# Patient Record
Sex: Female | Born: 1965 | Race: White | Hispanic: No | Marital: Married | Smoking: Never smoker
Health system: Southern US, Community
[De-identification: ages and names within clinical notes are randomized; demographics above are authoritative.]

## PROBLEM LIST (undated history)

## (undated) DIAGNOSIS — K8020 Calculus of gallbladder without cholecystitis without obstruction: Secondary | ICD-10-CM

## (undated) DIAGNOSIS — I10 Essential (primary) hypertension: Secondary | ICD-10-CM

---

## 1998-03-20 ENCOUNTER — Other Ambulatory Visit: Admission: RE | Admit: 1998-03-20 | Discharge: 1998-03-20 | Payer: Self-pay | Admitting: Obstetrics and Gynecology

## 1999-03-26 ENCOUNTER — Other Ambulatory Visit: Admission: RE | Admit: 1999-03-26 | Discharge: 1999-03-26 | Payer: Self-pay | Admitting: Obstetrics and Gynecology

## 2000-04-06 ENCOUNTER — Other Ambulatory Visit: Admission: RE | Admit: 2000-04-06 | Discharge: 2000-04-06 | Payer: Self-pay | Admitting: Obstetrics and Gynecology

## 2000-12-13 ENCOUNTER — Other Ambulatory Visit: Admission: RE | Admit: 2000-12-13 | Discharge: 2000-12-13 | Payer: Self-pay | Admitting: Obstetrics and Gynecology

## 2001-07-14 ENCOUNTER — Inpatient Hospital Stay (HOSPITAL_COMMUNITY): Admission: AD | Admit: 2001-07-14 | Discharge: 2001-07-16 | Payer: Self-pay | Admitting: Obstetrics and Gynecology

## 2001-08-21 ENCOUNTER — Other Ambulatory Visit: Admission: RE | Admit: 2001-08-21 | Discharge: 2001-08-21 | Payer: Self-pay | Admitting: Obstetrics and Gynecology

## 2002-03-16 ENCOUNTER — Encounter: Admission: RE | Admit: 2002-03-16 | Discharge: 2002-03-16 | Payer: Self-pay | Admitting: Obstetrics and Gynecology

## 2002-03-16 ENCOUNTER — Encounter: Payer: Self-pay | Admitting: Obstetrics and Gynecology

## 2002-08-30 ENCOUNTER — Other Ambulatory Visit: Admission: RE | Admit: 2002-08-30 | Discharge: 2002-08-30 | Payer: Self-pay | Admitting: Obstetrics and Gynecology

## 2003-06-27 ENCOUNTER — Encounter: Admission: RE | Admit: 2003-06-27 | Discharge: 2003-06-27 | Payer: Self-pay | Admitting: Obstetrics and Gynecology

## 2003-06-27 ENCOUNTER — Encounter: Payer: Self-pay | Admitting: Obstetrics and Gynecology

## 2003-09-03 ENCOUNTER — Other Ambulatory Visit: Admission: RE | Admit: 2003-09-03 | Discharge: 2003-09-03 | Payer: Self-pay | Admitting: Obstetrics and Gynecology

## 2004-07-08 ENCOUNTER — Ambulatory Visit (HOSPITAL_COMMUNITY): Admission: RE | Admit: 2004-07-08 | Discharge: 2004-07-08 | Payer: Self-pay | Admitting: Obstetrics and Gynecology

## 2005-08-25 ENCOUNTER — Ambulatory Visit (HOSPITAL_COMMUNITY): Admission: RE | Admit: 2005-08-25 | Discharge: 2005-08-25 | Payer: Self-pay | Admitting: Obstetrics and Gynecology

## 2006-08-29 ENCOUNTER — Ambulatory Visit (HOSPITAL_COMMUNITY): Admission: RE | Admit: 2006-08-29 | Discharge: 2006-08-29 | Payer: Self-pay | Admitting: Obstetrics and Gynecology

## 2007-09-08 ENCOUNTER — Ambulatory Visit (HOSPITAL_COMMUNITY): Admission: RE | Admit: 2007-09-08 | Discharge: 2007-09-08 | Payer: Self-pay | Admitting: Obstetrics and Gynecology

## 2008-10-01 ENCOUNTER — Ambulatory Visit (HOSPITAL_COMMUNITY): Admission: RE | Admit: 2008-10-01 | Discharge: 2008-10-01 | Payer: Self-pay | Admitting: Obstetrics and Gynecology

## 2009-10-06 ENCOUNTER — Ambulatory Visit (HOSPITAL_COMMUNITY): Admission: RE | Admit: 2009-10-06 | Discharge: 2009-10-06 | Payer: Self-pay | Admitting: Obstetrics and Gynecology

## 2010-10-09 ENCOUNTER — Ambulatory Visit (HOSPITAL_COMMUNITY)
Admission: RE | Admit: 2010-10-09 | Discharge: 2010-10-09 | Payer: Self-pay | Source: Home / Self Care | Attending: Obstetrics and Gynecology | Admitting: Obstetrics and Gynecology

## 2011-03-12 NOTE — H&P (Signed)
[facility]  Patient:    Breanna Harrison, Breanna Harrison Visit Number: [id number] MRN: [id number]          Service Type: OBS Location: 910B 9165 01 Attending Physician:  Ermalene Searing Dictated by:   Lenoard Aden, M.D. Admit Date:  07/14/2001   CC:         [facility]   History and Physical  CHIEF COMPLAINT:              Mild pregnancy-induced hypertension.  HISTORY OF PRESENT ILLNESS:   The patient is a 45 year old white female, G1, P0, EDD July 19, 2001, at 39+ weeks, with elevated blood pressure and mild epigastric discomfort today.  OBSTETRICAL HISTORY:          Noncontributory.  ALLERGIES:                    SULFA.  MEDICATIONS:                  Prenatal vitamin.  FAMILY HISTORY:               Hypertension, myocardial infarction, and hypoglycemia.  SOCIAL HISTORY:               Denies any domestic or physical violence. Social history otherwise noncontributory.  PRENATAL LABORATORY DATA:     Blood type O positive, Rh antibody negative. Toxoplasmosis negative.  VDRL nonreactive.  Rubella immune.  Hepatitis B surface antigen negative.  HIV nonreactive.  GC and chlamydia negative.  GBS negative.  Pregnancy complicated by new-onset pregnancy-induced hypertension at 37 weeks with increase in symptoms noted today.  Otherwise, uncomplicated obstetric care.  PHYSICAL EXAMINATION:  GENERAL:                      Well-developed, well-nourished white female in no apparent distress.  VITAL SIGNS:                  Blood pressure 130/90.  HEENT:                        Normal.  LUNGS:                        Clear.  HEART:                        Regular rhythm.  ABDOMEN:                      Soft, gravid, nontender.  Estimated fetal weight of 8 pounds.  PELVIC:                       Cervix is 2-3 cm, 50%, vertex, and -1.  EXTREMITIES:                  No cords.  NEUROLOGIC:                   Nonfocal.  DTRs 2+.  No evidence of  clonus.  IMPRESSION:                   Term IUP with mild pregnancy-induced hypertension.  PLAN:                         Proceed with induction.  Check CBC, PIH laboratories.  Anticipate attempts  at vaginal delivery. Dictated by:   Lenoard Aden, M.D. Attending Physician:  Marina Gravel B DD:  07/14/01 TD:  07/14/01 Job: [id number]

## 2011-03-12 NOTE — Op Note (Signed)
[facility]  Patient:    Breanna Harrison, Breanna Harrison Visit Number: [id number] MRN: [id number]          Service Type: OBS Location: 910A 9107 01 Attending Physician:  Ermalene Searing Dictated by:   Lenoard Aden, M.D. Proc. Date: 07/14/01 Admit Date:  07/14/2001                             Operative Report  INDICATION:                   Indication for operative vaginal delivery is repetitive deep and variable decelerations with fetal bradycardia.  OBSTETRICIAN:                 Lenoard Aden, M.D.  DELIVERY NOTE:                After observing aforementioned pattern with fetal heart tones ranging in the 90 to 100 beat-per-minute range and variable decelerations into the 60s, decision is made to proceed with operative vaginal delivery in the form of a Mityvac mushroom cup.  Fetal positioning is noted to be ROP, +3 station.  Mityvac mushroom cup is applied in the appropriate location for four gentle pulls for delivery of a full-term living female over a central median episiotomy without extension, shoulders delivered easily for a full-term living female, Apgars 8/9.  Placenta delivered spontaneously intact, three-vessel cord noted.  Cervix and vagina without lacerations.  Episiotomy repaired with a 2-0 Vicryl Rapide in the standard fashion without complication.  Estimated blood loss 500 cc.  Mother and baby recovering well. Rectum is intact. Dictated by:   Lenoard Aden, M.D. Attending Physician:  Marina Gravel B DD:  07/14/01 TD:  07/15/01 Job: [id number]

## 2011-09-14 ENCOUNTER — Other Ambulatory Visit (HOSPITAL_COMMUNITY): Payer: Self-pay | Admitting: Obstetrics and Gynecology

## 2011-09-14 DIAGNOSIS — Z1231 Encounter for screening mammogram for malignant neoplasm of breast: Secondary | ICD-10-CM

## 2011-10-14 ENCOUNTER — Ambulatory Visit (HOSPITAL_COMMUNITY)
Admission: RE | Admit: 2011-10-14 | Discharge: 2011-10-14 | Disposition: A | Discharge status: Home / Self Care | Payer: Managed Care, Other (non HMO) | Source: Ambulatory Visit | Attending: Obstetrics and Gynecology | Admitting: Obstetrics and Gynecology

## 2011-10-14 DIAGNOSIS — Z1231 Encounter for screening mammogram for malignant neoplasm of breast: Secondary | ICD-10-CM | POA: Insufficient documentation

## 2012-09-25 ENCOUNTER — Other Ambulatory Visit (HOSPITAL_COMMUNITY): Payer: Self-pay | Admitting: Obstetrics and Gynecology

## 2012-09-25 DIAGNOSIS — Z1231 Encounter for screening mammogram for malignant neoplasm of breast: Secondary | ICD-10-CM

## 2012-10-16 ENCOUNTER — Ambulatory Visit (HOSPITAL_COMMUNITY)
Admission: RE | Admit: 2012-10-16 | Discharge: 2012-10-16 | Disposition: A | Discharge status: Home / Self Care | Payer: Managed Care, Other (non HMO) | Source: Ambulatory Visit | Attending: Obstetrics and Gynecology | Admitting: Obstetrics and Gynecology

## 2012-10-16 DIAGNOSIS — Z1231 Encounter for screening mammogram for malignant neoplasm of breast: Secondary | ICD-10-CM

## 2013-09-27 ENCOUNTER — Other Ambulatory Visit (HOSPITAL_COMMUNITY): Payer: Self-pay | Admitting: Obstetrics and Gynecology

## 2013-09-27 DIAGNOSIS — Z1231 Encounter for screening mammogram for malignant neoplasm of breast: Secondary | ICD-10-CM

## 2013-10-22 ENCOUNTER — Ambulatory Visit (HOSPITAL_COMMUNITY)
Admission: RE | Admit: 2013-10-22 | Discharge: 2013-10-22 | Disposition: A | Discharge status: Home / Self Care | Payer: BC Managed Care – PPO | Source: Ambulatory Visit | Attending: Obstetrics and Gynecology | Admitting: Obstetrics and Gynecology

## 2013-10-22 DIAGNOSIS — Z1231 Encounter for screening mammogram for malignant neoplasm of breast: Secondary | ICD-10-CM | POA: Insufficient documentation

## 2014-10-04 ENCOUNTER — Other Ambulatory Visit (HOSPITAL_COMMUNITY): Payer: Self-pay | Admitting: Obstetrics and Gynecology

## 2014-10-04 DIAGNOSIS — Z1231 Encounter for screening mammogram for malignant neoplasm of breast: Secondary | ICD-10-CM

## 2014-10-24 ENCOUNTER — Ambulatory Visit (HOSPITAL_COMMUNITY)
Admission: RE | Admit: 2014-10-24 | Discharge: 2014-10-24 | Disposition: A | Discharge status: Home / Self Care | Payer: BC Managed Care – PPO | Source: Ambulatory Visit | Attending: Obstetrics and Gynecology | Admitting: Obstetrics and Gynecology

## 2014-10-24 DIAGNOSIS — Z1231 Encounter for screening mammogram for malignant neoplasm of breast: Secondary | ICD-10-CM | POA: Diagnosis present

## 2016-09-17 ENCOUNTER — Emergency Department (HOSPITAL_BASED_OUTPATIENT_CLINIC_OR_DEPARTMENT_OTHER): Payer: BLUE CROSS/BLUE SHIELD

## 2016-09-17 ENCOUNTER — Encounter (HOSPITAL_BASED_OUTPATIENT_CLINIC_OR_DEPARTMENT_OTHER): Payer: Self-pay

## 2016-09-17 ENCOUNTER — Inpatient Hospital Stay (HOSPITAL_BASED_OUTPATIENT_CLINIC_OR_DEPARTMENT_OTHER)
Admission: EM | Admit: 2016-09-17 | Discharge: 2016-09-21 | DRG: 419 | Disposition: A | Discharge status: Home / Self Care | Payer: BLUE CROSS/BLUE SHIELD | Attending: Internal Medicine | Admitting: Internal Medicine

## 2016-09-17 DIAGNOSIS — I10 Essential (primary) hypertension: Secondary | ICD-10-CM | POA: Diagnosis present

## 2016-09-17 DIAGNOSIS — R1011 Right upper quadrant pain: Secondary | ICD-10-CM

## 2016-09-17 DIAGNOSIS — Z8249 Family history of ischemic heart disease and other diseases of the circulatory system: Secondary | ICD-10-CM

## 2016-09-17 DIAGNOSIS — K8010 Calculus of gallbladder with chronic cholecystitis without obstruction: Secondary | ICD-10-CM

## 2016-09-17 DIAGNOSIS — K8050 Calculus of bile duct without cholangitis or cholecystitis without obstruction: Secondary | ICD-10-CM | POA: Diagnosis not present

## 2016-09-17 DIAGNOSIS — R740 Nonspecific elevation of levels of transaminase and lactic acid dehydrogenase [LDH]: Secondary | ICD-10-CM | POA: Diagnosis present

## 2016-09-17 DIAGNOSIS — K8064 Calculus of gallbladder and bile duct with chronic cholecystitis without obstruction: Secondary | ICD-10-CM | POA: Diagnosis present

## 2016-09-17 DIAGNOSIS — R7401 Elevation of levels of liver transaminase levels: Secondary | ICD-10-CM

## 2016-09-17 DIAGNOSIS — Z419 Encounter for procedure for purposes other than remedying health state, unspecified: Secondary | ICD-10-CM

## 2016-09-17 HISTORY — DX: Essential (primary) hypertension: I10

## 2016-09-17 HISTORY — DX: Calculus of gallbladder without cholecystitis without obstruction: K80.20

## 2016-09-17 LAB — URINALYSIS, ROUTINE W REFLEX MICROSCOPIC
Bilirubin Urine: NEGATIVE
Glucose, UA: NEGATIVE mg/dL
Hgb urine dipstick: NEGATIVE
Ketones, ur: NEGATIVE mg/dL
Nitrite: NEGATIVE
PROTEIN: NEGATIVE mg/dL
SPECIFIC GRAVITY, URINE: 1.007 (ref 1.005–1.030)
pH: 8 (ref 5.0–8.0)

## 2016-09-17 LAB — CBC WITH DIFFERENTIAL/PLATELET
BASOS ABS: 0 10*3/uL (ref 0.0–0.1)
BASOS PCT: 0 %
EOS ABS: 0.1 10*3/uL (ref 0.0–0.7)
EOS PCT: 1 %
HEMATOCRIT: 39.3 % (ref 36.0–46.0)
Hemoglobin: 13.2 g/dL (ref 12.0–15.0)
Lymphocytes Relative: 35 %
Lymphs Abs: 4.7 10*3/uL — ABNORMAL HIGH (ref 0.7–4.0)
MCH: 31.1 pg (ref 26.0–34.0)
MCHC: 33.6 g/dL (ref 30.0–36.0)
MCV: 92.5 fL (ref 78.0–100.0)
MONO ABS: 1.1 10*3/uL — AB (ref 0.1–1.0)
MONOS PCT: 8 %
Neutro Abs: 7.5 10*3/uL (ref 1.7–7.7)
Neutrophils Relative %: 56 %
PLATELETS: 265 10*3/uL (ref 150–400)
RBC: 4.25 MIL/uL (ref 3.87–5.11)
RDW: 11.7 % (ref 11.5–15.5)
WBC: 13.4 10*3/uL — ABNORMAL HIGH (ref 4.0–10.5)

## 2016-09-17 LAB — URINE MICROSCOPIC-ADD ON: RBC / HPF: NONE SEEN RBC/hpf (ref 0–5)

## 2016-09-17 LAB — COMPREHENSIVE METABOLIC PANEL
ALBUMIN: 4 g/dL (ref 3.5–5.0)
ALT: 119 U/L — ABNORMAL HIGH (ref 14–54)
ANION GAP: 8 (ref 5–15)
AST: 106 U/L — ABNORMAL HIGH (ref 15–41)
Alkaline Phosphatase: 129 U/L — ABNORMAL HIGH (ref 38–126)
BILIRUBIN TOTAL: 1.5 mg/dL — AB (ref 0.3–1.2)
BUN: 13 mg/dL (ref 6–20)
CHLORIDE: 106 mmol/L (ref 101–111)
CO2: 25 mmol/L (ref 22–32)
Calcium: 8.9 mg/dL (ref 8.9–10.3)
Creatinine, Ser: 0.91 mg/dL (ref 0.44–1.00)
GFR calc Af Amer: >60 mL/min (ref >60)
GFR calc non Af Amer: >60 mL/min (ref >60)
GLUCOSE: 102 mg/dL — AB (ref 65–99)
POTASSIUM: 3.6 mmol/L (ref 3.5–5.1)
Sodium: 139 mmol/L (ref 135–145)
TOTAL PROTEIN: 7.5 g/dL (ref 6.5–8.1)

## 2016-09-17 LAB — LIPASE, BLOOD: LIPASE: 23 U/L (ref 11–51)

## 2016-09-17 LAB — PREGNANCY, URINE: PREG TEST UR: NEGATIVE

## 2016-09-17 MED ORDER — MORPHINE SULFATE (PF) 4 MG/ML IV SOLN
4.0000 mg | Freq: Once | INTRAVENOUS | Status: AC
  Administered 2016-09-17: 4 mg via INTRAVENOUS
  Filled 2016-09-17: qty 1

## 2016-09-17 MED ORDER — OXYCODONE HCL 5 MG PO TABS
5.0000 mg | ORAL_TABLET | ORAL | Status: DC | PRN
  Administered 2016-09-18: 5 mg via ORAL
  Filled 2016-09-17: qty 1

## 2016-09-17 MED ORDER — SODIUM CHLORIDE 0.9 % IV SOLN
INTRAVENOUS | Status: DC
  Administered 2016-09-17 – 2016-09-19 (×3): via INTRAVENOUS

## 2016-09-17 MED ORDER — ACETAMINOPHEN 650 MG RE SUPP: 650.0000 mg | Freq: Four times a day (QID) | RECTAL | Status: DC | PRN

## 2016-09-17 MED ORDER — ONDANSETRON HCL 4 MG/2ML IJ SOLN: 4.0000 mg | Freq: Four times a day (QID) | INTRAMUSCULAR | Status: DC | PRN

## 2016-09-17 MED ORDER — ONDANSETRON HCL 4 MG PO TABS: 4.0000 mg | ORAL_TABLET | Freq: Four times a day (QID) | ORAL | Status: DC | PRN

## 2016-09-17 MED ORDER — HYDROMORPHONE HCL 1 MG/ML IJ SOLN: 0.5000 mg | INTRAMUSCULAR | Status: DC | PRN

## 2016-09-17 MED ORDER — SODIUM CHLORIDE 0.9 % IV SOLN
Freq: Once | INTRAVENOUS | Status: AC
  Administered 2016-09-17: 20:00:00 via INTRAVENOUS

## 2016-09-17 MED ORDER — ACETAMINOPHEN 325 MG PO TABS
650.0000 mg | ORAL_TABLET | Freq: Four times a day (QID) | ORAL | Status: DC | PRN
  Administered 2016-09-18 – 2016-09-21 (×2): 650 mg via ORAL
  Filled 2016-09-17 (×2): qty 2

## 2016-09-17 MED ORDER — ENOXAPARIN SODIUM 40 MG/0.4ML ~~LOC~~ SOLN
40.0000 mg | Freq: Every day | SUBCUTANEOUS | Status: DC
  Administered 2016-09-17 – 2016-09-20 (×4): 40 mg via SUBCUTANEOUS
  Filled 2016-09-17 (×4): qty 0.4

## 2016-09-17 NOTE — ED Notes (Signed)
Patient transported to Ultrasound 

## 2016-09-17 NOTE — H&P (Signed)
History and Physical  Patient Name: Breanna Harrison     UJW:[id number]:[id number]    DOB: 12/18/1965    DOA: 09/17/2016 PCP: Eather ColasHUNTER, [name], FNP   Patient coming from: Home --> [facility]  Chief Complaint: Epigastric pain  HPI: Breanna Harrison is a 50 y.o. female with a past medical history significant for biliary colic and HTN who presents with epigastric pain for one day.  The patient has had several episodes of epigastric pain associated with nausea, not clearly associated with food, lasting 4-5 hours at a time, over the last several months. She saw her PCP about this 3 weeks ago after a particularly bad episode related to a weekend of more than usual alcohol use, had a right upper quadrant ultrasound that showed gallstones, was referred to a general surgeon, who suspected mild pancreatitis rather than gallstones and recommended watchful waiting.  Now in the last 24 hours, she developed her epigastric pain again.  This is severe, "like a knife under my breastbone" and radiating to the back and associated with nausea.  ED course: -Afebrile, heart rate 60s, respirations and pulse ox symmetry normal, blood pressure 179/91 -Na 139, K 3.6, Cr 0.91, WBC 13.4K, Hgb 13.2, UA showed leukocytes, pregnancy test negative -AST and ALT 106 and 119, respectively. Total bilirubin 1.5 -Lipase normal -Right upper quadrant ultrasound showed gallstones as well as a 10 mm CBD -The case was discussed with [place] [facility] Dr. Adela LankArmbruster who recommended trending LFT and GI consult for likely MRCP/ERCP and TRH were asked to accept in transfer for choledocholithiasis         ROS: Review of Systems  Constitutional: Negative for chills and fever.  Gastrointestinal: Positive for abdominal pain and nausea. Negative for vomiting.  All other systems reviewed and are negative.         Past Medical History:  Diagnosis Date  . Gallstones   . Hypertension     History reviewed. No pertinent surgical history.  Social History:  Patient lives with her husband and 827 year old son.  The patient walks unasisted.  She works for [employer] in [place].  She drinks rarely, sometimes in binges.  She does not smoke.  She is from [place] originally.    Allergies  Allergen Reactions  . Sulfamethoxazole Itching and Rash    Family history: family history includes Breast cancer in her mother; Gallbladder disease in her mother; Heart attack (age of onset: 5759) in her father; Parkinson's disease in her father; Stroke (age of onset: 2740) in her brother.  Prior to Admission medications   Medication Sig Start Date End Date Taking? Authorizing Provider  alum & mag hydroxide-simeth (MAALOX/MYLANTA) 200-200-20 MG/5ML suspension Take 30 mLs by mouth every 6 (six) hours as needed for indigestion or heartburn.   Yes Historical Provider, MD  aspirin-acetaminophen-caffeine (EXCEDRIN MIGRAINE) [phone] MG tablet Take 1 tablet by mouth every 6 (six) hours as needed for headache.   Yes Historical Provider, MD  calcium carbonate (TUMS - DOSED IN MG ELEMENTAL CALCIUM) 500 MG chewable tablet Chew 3 tablets by mouth as needed for indigestion or heartburn.   Yes Historical Provider, MD  Cholecalciferol (VITAMIN D3) 3000 units TABS Take 2 tablets by mouth daily with breakfast.   Yes Historical Provider, MD  lisinopril (PRINIVIL,ZESTRIL) 5 MG tablet Take 5 mg by mouth daily with breakfast.    Yes Historical Provider, MD  Multiple Vitamin (MULTIVITAMIN WITH MINERALS) TABS tablet Take 1 tablet by mouth daily with breakfast.   Yes Historical Provider, MD  naproxen sodium (ANAPROX) 220 MG tablet Take 440 mg by mouth daily as needed (for pain).   Yes Historical Provider, MD  Norethin Ace-Eth Estrad-FE (MELODETTA 24 FE) 1-20 MG-MCG(24) CHEW Chew 1 tablet by mouth daily.   Yes Historical Provider, MD       Physical Exam: BP 132/73 (BP Location: Right Arm)   Pulse 61   Temp 98 F (36.7 C) (Oral)   Resp 16   Ht 5\' 3"  (1.6 m)   Wt 67.1 kg (148 lb)   LMP  08/25/2016   SpO2 97%   BMI 26.22 kg/m  General appearance: Well-developed, adult female, alert and in no acute distress.   Eyes: Anicteric, conjunctiva pink, lids and lashes normal. PERRL.    ENT: No nasal deformity, discharge, epistaxis.  Hearing normal. OP moist without lesions.   Neck: No neck masses.  Trachea midline.  No thyromegaly/tenderness. Lymph: No cervical or supraclavicular lymphadenopathy. Skin: Warm and dry.  No jaundice.  No suspicious rashes or lesions. Cardiac: RRR, nl S1-S2, no murmurs appreciated.  Capillary refill is brisk.  JVP normal.  No LE edema.  Radial and DP pulses 2+ and symmetric. Respiratory: Normal respiratory rate and rhythm.  CTAB without rales or wheezes. Abdomen: Abdomen soft.  No TTP or guarding. No ascites, distension, hepatosplenomegaly.   MSK: No deformities or effusions.  No cyanosis or clubbing. Neuro: Cranial nerves normal.  Sensation intact to light touch. Speech is fluent.  Muscle strength normal.    Psych: Sensorium intact and responding to questions, attention normal.  Behavior appropriate.  Affect normal.  Judgment and insight appear normal.     Labs on Admission:  I have personally reviewed following labs and imaging studies: CBC:  Recent Labs Lab 09/17/16 1729  WBC 13.4*  NEUTROABS 7.5  HGB 13.2  HCT 39.3  MCV 92.5  PLT 265   Basic Metabolic Panel:  Recent Labs Lab 09/17/16 1729  NA 139  K 3.6  CL 106  CO2 25  GLUCOSE 102*  BUN 13  CREATININE 0.91  CALCIUM 8.9   GFR: Estimated Creatinine Clearance: 67.3 mL/min (by C-G formula based on SCr of 0.91 mg/dL).  Liver Function Tests:  Recent Labs Lab 09/17/16 1729  AST 106*  ALT 119*  ALKPHOS 129*  BILITOT 1.5*  PROT 7.5  ALBUMIN 4.0    Recent Labs Lab 09/17/16 1729  LIPASE 23   No results for input(s): AMMONIA in the last 168 hours. Coagulation Profile: No results for input(s): INR, PROTIME in the last 168 hours. Cardiac Enzymes: No results for  input(s): CKTOTAL, CKMB, CKMBINDEX, TROPONINI in the last 168 hours. BNP (last 3 results) No results for input(s): PROBNP in the last 8760 hours. HbA1C: No results for input(s): HGBA1C in the last 72 hours. CBG: No results for input(s): GLUCAP in the last 168 hours. Lipid Profile: No results for input(s): CHOL, HDL, LDLCALC, TRIG, CHOLHDL, LDLDIRECT in the last 72 hours. Thyroid Function Tests: No results for input(s): TSH, T4TOTAL, FREET4, T3FREE, THYROIDAB in the last 72 hours. Anemia Panel: No results for input(s): VITAMINB12, FOLATE, FERRITIN, TIBC, IRON, RETICCTPCT in the last 72 hours. Sepsis Labs: Invalid input(s): PROCALCITONIN, LACTICIDVEN No results found for this or any previous visit (from the past 240 hour(s)).       Radiological Exams on Admission: Personally reviewed [place] report: US Abdomen Limited Ruq  Result Date: 09/17/2016 CLINICAL DATA:  Epigastric pain for 1 day EXAM: US ABDOMEN LIMITED - RIGHT UPPER QUADRANT COMPARISON:  09/03/2016 FINDINGS: Gallbladder:  Cholelithiasis is noted and well distended gallbladder. No significant gallbladder wall thickening is seen. No pericholecystic fluid is noted. Mild increased tenderness is noted over the gallbladder. Common bile duct: Diameter: 10 mm. There is increased echogenicity noted in the distal common bile duct with significant enlargement when compared with the prior exam consistent with distal common bile duct stones. Previous common bile duct measurement was 3 mm. Liver: No focal lesion identified. Mild intrahepatic ductal dilatation is seen. IMPRESSION: Cholelithiasis as well as evidence of distal common bile duct stones with significant biliary dilatation when compared with the prior exam. Electronically Signed   By: Alcide CleverMark  Lukens M.D.   On: 09/17/2016 18:32        Assessment/Plan Principal Problem:   Choledocholithiasis Active Problems:   Essential hypertension  1. Choledocholithiasis:  No imaging evidence of  cholecystitis, pain currently resolved.  Will defer antibiotics for now. -Trend CBC -NPO and MIVF -Ondansetron for nausea, oxyodone or hydromorphone IV for pain -Trend LFT -Consult to GI, appreciate cares -Consult to General Surgery for consideration of cholecystectomy post-ER/MRCP    2. HTN:  -Continue lisinopril  3. Other medications:  -Continue home OCPs          DVT prophylaxis: Lovenox  Code Status: Full  Family Communication: None present  Disposition Plan: Anticipate IV fluids, NPO and trend LFTs.  GI consult for recommendations re: MR vs ERCP.  Then General Surgery consultation. Consults called: Gen Surg, GI Admission status: INPATIENT, med surg         Medical decision making: Patient seen at 10:15 PM on 09/17/2016.  The patient was discussed with Dr. Andrey CampanileWilson and Mattie MarlinJessica Focht, PA-C.  What exists of the patient's chart and outside records in CareEverywhere was reviewed in depth and summarized above.  Clinical condition: stable.        Alberteen SamChristopher P Cheryl Stabenow [facility] Pager [phone]

## 2016-09-17 NOTE — Progress Notes (Signed)
50 yo F with history of biliary colic presents with choledocholithiasis.  BP 124/83 (BP Location: Right Arm)   Pulse 61   Temp 98 F (36.7 C) (Oral)   Resp 16   Ht 5\' 3"  (1.6 m)   Wt 67.1 kg (148 lb)   LMP 08/25/2016   SpO2 99%   BMI 26.22 kg/m    AST/ALT mildly elevated, TBili 1.5.  RUQ US shows dilated CBD.  Lipase normal.   GI Armbruster will see tomorrow.  Trend LFTs.  If trending down, CCS may take to surgery, if stable or trending up, likely will need MRCP/ERCP first, per GI.   To med surg, inpt status.  Please add [facility]

## 2016-09-17 NOTE — ED Notes (Signed)
ED Provider at bedside. 

## 2016-09-17 NOTE — Progress Notes (Signed)
Breanna Harrison is a 50 y.o. female patient admitted from on transportation awake, alert - oriented  X 4 - no acute distress noted.  VSS -     IV in place, occlusive dsg intact without redness.  Orientation to room, and floor completed with information packet given to patient/family.  Patient declined safety video at this time.  Admission INP armband ID verified with patient/family, and in place.   SR up x 2, fall assessment complete, with patient and family able to verbalize understanding of risk associated with falls, and verbalized understanding to call nsg before up out of bed.  Call light within reach, patient able to voice, and demonstrate understanding.  Skin, clean-dry- intact without evidence of bruising, or skin tears.   No evidence of skin break down noted on exam.    Will cont to eval and treat per MD orders.  Breanna HeftyPatricia D Ramirez, RN 09/17/2016 10:19 PM

## 2016-09-17 NOTE — ED Notes (Signed)
Report given to PAtty RN 5 west, nurse aware pt will be coming by POV

## 2016-09-17 NOTE — ED Triage Notes (Signed)
C/o abd pain that feels same as recent dx of gallstone-NAD-steady gait

## 2016-09-17 NOTE — Progress Notes (Signed)
Paged on call MD about patient's arrival to unit/floor.

## 2016-09-17 NOTE — ED Provider Notes (Signed)
[facility] Provider Note   CSN: [id number] Arrival date & time: 09/17/16  1549  By signing my name below, I, [name], attest that this documentation has been prepared under the direction and in the presence of Mattie Marlin, PA-C. Electronically Signed: [name], Scribe. 09/17/2016. 5:51 PM.  History   Chief Complaint Chief Complaint  Patient presents with  . Abdominal Pain    The history is provided by the patient. No language interpreter was used.     HPI Comments:  Breanna Harrison is a 50 y.o. female who presents to the Emergency Department complaining of constant abdominal pain since yesterday. Pt reports diffuse pain at this time but notes RUQ pain at time of onset. She describes her pain as sharp and notes the pain radiates into her mid back. Pt has been experiecing this same pain intermittently x a few months secondary to gallstones. She notes she had a "bad" episode on 08/24/16 after eating fried foods; notes she has not had an episode until yesterday. She had greasy foods last night. Pt has been taking Nexium for acid reflux but has been off of it for ~ 2 months. She reports associated nausea. She has taken aleve and tylenol without relief. No vomiting, melena, fever, chills, SOB, hematuria, dysuria, and frequent/urgent urination. No alleviating factors noted. Pt has also had a surgical consult about the possible removal of her gallbladder but nothing was scheduled as the surgeon stated pt's pancreatic enzymes were not elevated. No h/o abdominal surgeries. Pt also has BMs infrequently; states she has 1 ~once a week and this is a chronic occurrence for her.  ETOH use is sparring. Pt last ate ~1300 but she has been drinking water since.   Past Medical History:  Diagnosis Date  . Gallstones   . Hypertension     There are no active problems to display for this patient.   History reviewed. No pertinent surgical history.  OB History    No data available       Home Medications    Prior to Admission medications   Medication Sig Start Date End Date Taking? Authorizing Provider  LISINOPRIL PO Take by mouth.   Yes Historical Provider, MD    Family History No family history on file.  Social History Social History  Substance Use Topics  . Smoking status: Never Smoker  . Smokeless tobacco: Never Used  . Alcohol use Yes     Comment: occ     Allergies   Sulfa antibiotics   Review of Systems Review of Systems  Constitutional: Negative for chills and fever.  Respiratory: Negative for shortness of breath.   Gastrointestinal: Positive for abdominal pain and nausea. Negative for blood in stool and vomiting.  Genitourinary: Negative for dysuria, frequency, hematuria and urgency.  Musculoskeletal: Positive for back pain.  All other systems reviewed and are negative.    Physical Exam Updated Vital Signs BP 179/91 (BP Location: Left Arm)   Pulse 69   Temp 98 F (36.7 C) (Oral)   Resp 18   Ht 5\' 3"  (1.6 m)   Wt 148 lb (67.1 kg)   LMP 08/25/2016   SpO2 100%   BMI 26.22 kg/m   Physical Exam  Constitutional: She is oriented to person, place, and time. She appears well-developed and well-nourished. No distress.  HENT:  Head: Normocephalic and atraumatic.  Eyes: Conjunctivae are normal.  Neck: Normal range of motion.  Cardiovascular: Normal rate, regular rhythm and normal heart sounds.  Exam reveals  no gallop and no friction rub.   No murmur heard. Pulses:      Radial pulses are 2+ on the right side, and 2+ on the left side.  Pulmonary/Chest: Effort normal and breath sounds normal. No respiratory distress. She has no decreased breath sounds. She has no wheezes. She has no rhonchi. She has no rales.  Abdominal: Soft. Normal appearance and bowel sounds are normal. She exhibits no distension. There is tenderness in the right upper quadrant, epigastric area and suprapubic area. There is positive Murphy's sign. There is no rigidity,  no rebound, no guarding and no CVA tenderness.  Musculoskeletal: Normal range of motion. She exhibits no edema.  Neurological: She is alert and oriented to person, place, and time.  Skin: Skin is warm and dry.  Psychiatric: She has a normal mood and affect.  Nursing note and vitals reviewed.    ED Treatments / Results  DIAGNOSTIC STUDIES:  Oxygen Saturation is 100% on RA, normal by my interpretation.    COORDINATION OF CARE:  5:47 PM Discussed treatment plan with pt at bedside and pt agreed to plan.  Labs (all labs ordered are listed, but only abnormal results are displayed) Labs Reviewed  URINALYSIS, ROUTINE W REFLEX MICROSCOPIC (NOT AT [facility]) - Abnormal; Notable for the following:       Result Value   Leukocytes, UA SMALL (*)    All other components within normal limits  URINE MICROSCOPIC-ADD ON - Abnormal; Notable for the following:    Squamous Epithelial / LPF 0-5 (*)    Bacteria, UA MANY (*)    All other components within normal limits  PREGNANCY, URINE  CBC WITH DIFFERENTIAL/PLATELET  COMPREHENSIVE METABOLIC PANEL    EKG  EKG Interpretation None       Radiology No results found.  Procedures Procedures (including critical care time)  Medications Ordered in ED Medications - No data to display   Initial Impression / Assessment and Plan / ED Course  I have reviewed the triage vital signs and the nursing notes.  Pertinent labs & imaging results that were available during my care of the patient were reviewed by me and considered in my medical decision making (see chart for details).  Clinical Course    Pt with abdominal pain. Hx of Cholelithiasis. RUQ ultrasound revealed cholelithiasis with common bile duct dilatation concerning for common bile duct obstruction. Mild leukocytosis, afebrile. Elevated LFTs, total bilirubin, and alkaline phosphatase. Normal lipase. Patient was made nothing by mouth and started on fluids in the ED. Her pain was well-controlled. I  consulted GI and spoke with Dr. Adela LankArmbruster who will see the patient tomorrow in the a.m. and asked that we consult the hospitalist team for admission. I counseled the hospitalist and spoke with Dr. Sherrill Raringanford Makanda who admitted the patient.  [facility] by her husband. Dr. Clarene DukeLittle was agreeable to this. Patient was stable at time of discharge.  Thank you Dr. Adela LankArmbruster and Dr. Maryfrances Bunnellanford for your consult, timing care of this patient.  Patient case discussed and patient seen by Dr. Clarene DukeLittle who agrees with the above plan.  Final Clinical Impressions(s) / ED Diagnoses   Final diagnoses:  RUQ pain    New Prescriptions New Prescriptions   No medications on file   I personally performed the services described in this documentation, which was scribed in my presence. The recorded information has been reviewed and is accurate.       Jerre SimonJessica L Nikola Blackston,  PA 09/17/16 2122    Laurence Spatesachel Morgan Little, MD 09/21/16 [phone]

## 2016-09-18 ENCOUNTER — Encounter (HOSPITAL_COMMUNITY)
Admission: EM | Disposition: A | Discharge status: Home / Self Care | Payer: Self-pay | Source: Home / Self Care | Attending: Internal Medicine

## 2016-09-18 ENCOUNTER — Encounter (HOSPITAL_COMMUNITY): Payer: Self-pay | Admitting: *Deleted

## 2016-09-18 ENCOUNTER — Inpatient Hospital Stay (HOSPITAL_COMMUNITY): Payer: BLUE CROSS/BLUE SHIELD

## 2016-09-18 ENCOUNTER — Inpatient Hospital Stay (HOSPITAL_COMMUNITY): Payer: BLUE CROSS/BLUE SHIELD | Admitting: Anesthesiology

## 2016-09-18 DIAGNOSIS — K8010 Calculus of gallbladder with chronic cholecystitis without obstruction: Secondary | ICD-10-CM

## 2016-09-18 DIAGNOSIS — K8050 Calculus of bile duct without cholangitis or cholecystitis without obstruction: Secondary | ICD-10-CM

## 2016-09-18 DIAGNOSIS — R740 Nonspecific elevation of levels of transaminase and lactic acid dehydrogenase [LDH]: Secondary | ICD-10-CM

## 2016-09-18 HISTORY — PX: ERCP: SHX5425

## 2016-09-18 LAB — CBC
HEMATOCRIT: 36.9 % (ref 36.0–46.0)
HEMOGLOBIN: 12.4 g/dL (ref 12.0–15.0)
MCH: 31.2 pg (ref 26.0–34.0)
MCHC: 33.6 g/dL (ref 30.0–36.0)
MCV: 92.7 fL (ref 78.0–100.0)
Platelets: 249 10*3/uL (ref 150–400)
RBC: 3.98 MIL/uL (ref 3.87–5.11)
RDW: 12.4 % (ref 11.5–15.5)
WBC: 10.7 10*3/uL — ABNORMAL HIGH (ref 4.0–10.5)

## 2016-09-18 LAB — COMPREHENSIVE METABOLIC PANEL
ALBUMIN: 3.5 g/dL (ref 3.5–5.0)
ALK PHOS: 130 U/L — AB (ref 38–126)
ALT: 149 U/L — ABNORMAL HIGH (ref 14–54)
ANION GAP: 5 (ref 5–15)
AST: 117 U/L — ABNORMAL HIGH (ref 15–41)
BILIRUBIN TOTAL: 1.4 mg/dL — AB (ref 0.3–1.2)
BUN: 14 mg/dL (ref 6–20)
CALCIUM: 8.4 mg/dL — AB (ref 8.9–10.3)
CO2: 22 mmol/L (ref 22–32)
CREATININE: 0.92 mg/dL (ref 0.44–1.00)
Chloride: 112 mmol/L — ABNORMAL HIGH (ref 101–111)
GFR calc Af Amer: >60 mL/min (ref >60)
GFR calc non Af Amer: >60 mL/min (ref >60)
GLUCOSE: 90 mg/dL (ref 65–99)
Potassium: 3.9 mmol/L (ref 3.5–5.1)
Sodium: 139 mmol/L (ref 135–145)
TOTAL PROTEIN: 6.6 g/dL (ref 6.5–8.1)

## 2016-09-18 SURGERY — ERCP, WITH INTERVENTION IF INDICATED
Anesthesia: General

## 2016-09-18 MED ORDER — DEXAMETHASONE SODIUM PHOSPHATE 10 MG/ML IJ SOLN
INTRAMUSCULAR | Status: DC | PRN
  Administered 2016-09-18: 10 mg via INTRAVENOUS

## 2016-09-18 MED ORDER — LISINOPRIL 5 MG PO TABS
5.0000 mg | ORAL_TABLET | Freq: Every day | ORAL | Status: DC
  Administered 2016-09-18 – 2016-09-21 (×3): 5 mg via ORAL
  Filled 2016-09-18 (×3): qty 1

## 2016-09-18 MED ORDER — LIDOCAINE 2% (20 MG/ML) 5 ML SYRINGE
INTRAMUSCULAR | Status: DC | PRN
  Administered 2016-09-18: 80 mg via INTRAVENOUS

## 2016-09-18 MED ORDER — PROPOFOL 10 MG/ML IV BOLUS
INTRAVENOUS | Status: DC | PRN
  Administered 2016-09-18: 60 mg via INTRAVENOUS
  Administered 2016-09-18: 140 mg via INTRAVENOUS

## 2016-09-18 MED ORDER — SUGAMMADEX SODIUM 200 MG/2ML IV SOLN
INTRAVENOUS | Status: AC
  Filled 2016-09-18: qty 2

## 2016-09-18 MED ORDER — FENTANYL CITRATE (PF) 100 MCG/2ML IJ SOLN
INTRAMUSCULAR | Status: AC
  Filled 2016-09-18: qty 2

## 2016-09-18 MED ORDER — SUCCINYLCHOLINE CHLORIDE 200 MG/10ML IV SOSY
PREFILLED_SYRINGE | INTRAVENOUS | Status: DC | PRN
Start: 2016-09-18 — End: 2016-09-18
  Administered 2016-09-18: 100 mg via INTRAVENOUS

## 2016-09-18 MED ORDER — LIDOCAINE 2% (20 MG/ML) 5 ML SYRINGE
INTRAMUSCULAR | Status: AC
Start: 2016-09-18 — End: 2016-09-18
  Filled 2016-09-18: qty 5

## 2016-09-18 MED ORDER — GLUCAGON HCL RDNA (DIAGNOSTIC) 1 MG IJ SOLR
INTRAMUSCULAR | Status: AC
  Filled 2016-09-18: qty 1

## 2016-09-18 MED ORDER — NORETHIN ACE-ETH ESTRAD-FE 1-20 MG-MCG(24) PO CHEW
1.0000 | CHEWABLE_TABLET | Freq: Every day | ORAL | Status: DC
  Administered 2016-09-21: 1 via ORAL

## 2016-09-18 MED ORDER — ESMOLOL HCL 100 MG/10ML IV SOLN
INTRAVENOUS | Status: DC | PRN
  Administered 2016-09-18: 40 mg via INTRAVENOUS

## 2016-09-18 MED ORDER — IOPAMIDOL (ISOVUE-370) INJECTION 76%
INTRAVENOUS | Status: DC | PRN
  Administered 2016-09-18: 55 mg via INTRAVENOUS

## 2016-09-18 MED ORDER — CIPROFLOXACIN IN D5W 400 MG/200ML IV SOLN: INTRAVENOUS | Status: DC | PRN

## 2016-09-18 MED ORDER — MIDAZOLAM HCL 2 MG/2ML IJ SOLN
INTRAMUSCULAR | Status: AC
  Filled 2016-09-18: qty 2

## 2016-09-18 MED ORDER — PROPOFOL 10 MG/ML IV BOLUS
INTRAVENOUS | Status: AC
  Filled 2016-09-18: qty 20

## 2016-09-18 MED ORDER — CIPROFLOXACIN IN D5W 400 MG/200ML IV SOLN
400.0000 mg | Freq: Once | INTRAVENOUS | Status: AC
  Administered 2016-09-18: 400 mg via INTRAVENOUS

## 2016-09-18 MED ORDER — ONDANSETRON HCL 4 MG/2ML IJ SOLN
INTRAMUSCULAR | Status: AC
  Filled 2016-09-18: qty 2

## 2016-09-18 MED ORDER — LACTATED RINGERS IV SOLN
INTRAVENOUS | Status: DC
  Administered 2016-09-18: 1000 mL via INTRAVENOUS

## 2016-09-18 MED ORDER — FENTANYL CITRATE (PF) 100 MCG/2ML IJ SOLN
INTRAMUSCULAR | Status: DC | PRN
  Administered 2016-09-18 (×2): 50 ug via INTRAVENOUS

## 2016-09-18 MED ORDER — MIDAZOLAM HCL 5 MG/5ML IJ SOLN
INTRAMUSCULAR | Status: DC | PRN
  Administered 2016-09-18: 2 mg via INTRAVENOUS

## 2016-09-18 MED ORDER — INDOMETHACIN 50 MG RE SUPP
RECTAL | Status: AC
  Filled 2016-09-18: qty 2

## 2016-09-18 MED ORDER — CIPROFLOXACIN IN D5W 400 MG/200ML IV SOLN
INTRAVENOUS | Status: AC
  Filled 2016-09-18: qty 200

## 2016-09-18 MED ORDER — ONDANSETRON HCL 4 MG/2ML IJ SOLN
INTRAMUSCULAR | Status: DC | PRN
  Administered 2016-09-18: 4 mg via INTRAVENOUS

## 2016-09-18 NOTE — Progress Notes (Signed)
PROGRESS NOTE                                                                                                                                                                                                             Patient Demographics:    Breanna Harrison, is a 50 y.o. female, DOB - 08/28/1966, WUJ:[id number]  Admit date - 09/17/2016   Admitting Physician Alberteen Sam, MD  Outpatient Primary MD for the patient is HUNTER, [name], FNP  LOS - 1  Outpatient Specialists:None  Chief Complaint  Patient presents with  . Abdominal Pain       Brief Narrative   50 year old female with hypertension and recently diagnosed biliary colic presenting with epigastric and right upper quadrant abdominal pain. Patient found to have acute choledocholithiasis.    Subjective:   Abdominal pain much better. Denies nausea or vomiting.   Assessment  & Plan :    Principal Problem:   Acute Choledocholithiasis  Symptoms better with pain medication. Transaminitis persistent. Nothing by mouth. Supportive care with IV fluids and antiemetics. -Evaluated by GI and surgery.  recommend ERCP followed by cholecystectomy. - Active Problems:   Essential hypertension Stable. Continue lisinopril.     Code Status : Full code  Family Communication  : Husband and mother bedside  Disposition Plan  : Home after surgery  Barriers For Discharge : Active symptoms  Consults  :  GI CCS  Procedures  :  CT abdomen Pending ERCP and laparoscopic cholecystectomy  DVT Prophylaxis  :  Lovenox -   Lab Results  Component Value Date   PLT 249 09/18/2016    Antibiotics  :  None  Anti-infectives    None        Objective:   Vitals:   09/17/16 1913 09/17/16 2034 09/17/16 2145 09/18/16 0600  BP: 124/83 130/79 132/73 (!) 147/77  Pulse: 61 67 61 (!) 56  Resp: 16 16 16 17   Temp:    97.9 F (36.6 C)  TempSrc:      SpO2: 99% 100% 97%  98%  Weight:      Height:        Wt Readings from Last 3 Encounters:  09/17/16 67.1 kg (148 lb)     Intake/Output Summary (Last 24 hours) at 09/18/16 1216 Last data filed at 09/18/16 1000  Gross per  24 hour  Intake             1500 ml  Output                0 ml  Net             1500 ml     Physical Exam  Gen: not in distress HEENT: moist mucosa, supple neck Chest: clear b/l, no added sounds CVS: N S1&S2, no murmurs, GI: soft, Nondistended, bowel sounds present, mild right upper quadrant tenderness Musculoskeletal: warm, no edema     Data Review:    CBC  Recent Labs Lab 09/17/16 1729 09/18/16 0523  WBC 13.4* 10.7*  HGB 13.2 12.4  HCT 39.3 36.9  PLT 265 249  MCV 92.5 92.7  MCH 31.1 31.2  MCHC 33.6 33.6  RDW 11.7 12.4  LYMPHSABS 4.7*  --   MONOABS 1.1*  --   EOSABS 0.1  --   BASOSABS 0.0  --     Chemistries   Recent Labs Lab 09/17/16 1729 09/18/16 0523  NA 139 139  K 3.6 3.9  CL 106 112*  CO2 25 22  GLUCOSE 102* 90  BUN 13 14  CREATININE 0.91 0.92  CALCIUM 8.9 8.4*  AST 106* 117*  ALT 119* 149*  ALKPHOS 129* 130*  BILITOT 1.5* 1.4*   ------------------------------------------------------------------------------------------------------------------ No results for input(s): CHOL, HDL, LDLCALC, TRIG, CHOLHDL, LDLDIRECT in the last 72 hours.  No results found for: HGBA1C ------------------------------------------------------------------------------------------------------------------ No results for input(s): TSH, T4TOTAL, T3FREE, THYROIDAB in the last 72 hours.  Invalid input(s): FREET3 ------------------------------------------------------------------------------------------------------------------ No results for input(s): VITAMINB12, FOLATE, FERRITIN, TIBC, IRON, RETICCTPCT in the last 72 hours.  Coagulation profile No results for input(s): INR, PROTIME in the last 168 hours.  No results for input(s): DDIMER in the last 72  hours.  Cardiac Enzymes No results for input(s): CKMB, TROPONINI, MYOGLOBIN in the last 168 hours.  Invalid input(s): CK ------------------------------------------------------------------------------------------------------------------ No results found for: BNP  Inpatient Medications  Scheduled Meds: . enoxaparin (LOVENOX) injection  40 mg Subcutaneous QHS  . lisinopril  5 mg Oral Q breakfast  . Norethin Ace-Eth Estrad-FE  1 tablet Oral Daily   Continuous Infusions: . sodium chloride 125 mL/hr at 09/18/16 0715   PRN Meds:.acetaminophen **OR** acetaminophen, HYDROmorphone (DILAUDID) injection, ondansetron **OR** ondansetron (ZOFRAN) IV, oxyCODONE  Micro Results No results found for this or any previous visit (from the past 240 hour(s)).  Radiology Reports [place] Abdomen Limited Ruq  Result Date: 09/17/2016 CLINICAL DATA:  Epigastric pain for 1 day EXAM: US ABDOMEN LIMITED - RIGHT UPPER QUADRANT COMPARISON:  09/03/2016 FINDINGS: Gallbladder: Cholelithiasis is noted and well distended gallbladder. No significant gallbladder wall thickening is seen. No pericholecystic fluid is noted. Mild increased tenderness is noted over the gallbladder. Common bile duct: Diameter: 10 mm. There is increased echogenicity noted in the distal common bile duct with significant enlargement when compared with the prior exam consistent with distal common bile duct stones. Previous common bile duct measurement was 3 mm. Liver: No focal lesion identified. Mild intrahepatic ductal dilatation is seen. IMPRESSION: Cholelithiasis as well as evidence of distal common bile duct stones with significant biliary dilatation when compared with the prior exam. Electronically Signed   By: Alcide CleverMark  Lukens M.D.   On: 09/17/2016 18:32    Time Spent in minutes  25   Eddie NorthHUNGEL, Weldon Nouri M.D on 09/18/2016 at 12:16 PM  Between 7am to 7pm - Pager - [phone]  After 7pm go to [email or web address] - password  [email or web address]  [facility] -   Office  [phone]

## 2016-09-18 NOTE — Anesthesia Preprocedure Evaluation (Addendum)
Anesthesia Evaluation  Patient identified by MRN, date of birth, ID band Patient awake    Reviewed: Allergy & Precautions, NPO status , Patient's Chart, lab work & pertinent test results  Airway Mallampati: II  TM Distance: >3 FB Neck ROM: Full    Dental  (+) Dental Advisory Given   Pulmonary neg pulmonary ROS,    breath sounds clear to auscultation       Cardiovascular hypertension, Pt. on medications  Rhythm:Regular Rate:Normal     Neuro/Psych negative neurological ROS     GI/Hepatic negative GI ROS, Neg liver ROS,   Endo/Other  negative endocrine ROS  Renal/GU negative Renal ROS     Musculoskeletal negative musculoskeletal ROS (+)   Abdominal   Peds  Hematology negative hematology ROS (+)   Anesthesia Other Findings   Reproductive/Obstetrics                            Lab Results  Component Value Date   WBC 10.7 (H) 09/18/2016   HGB 12.4 09/18/2016   HCT 36.9 09/18/2016   MCV 92.7 09/18/2016   PLT 249 09/18/2016   Lab Results  Component Value Date   CREATININE 0.92 09/18/2016   BUN 14 09/18/2016   NA 139 09/18/2016   K 3.9 09/18/2016   CL 112 (H) 09/18/2016   CO2 22 09/18/2016    Anesthesia Physical Anesthesia Plan  ASA: II  Anesthesia Plan: General   Post-op Pain Management:    Induction: Intravenous  Airway Management Planned: Oral ETT  Additional Equipment:   Intra-op Plan:   Post-operative Plan: Extubation in OR  Informed Consent: I have reviewed the patients History and Physical, chart, labs and discussed the procedure including the risks, benefits and alternatives for the proposed anesthesia with the patient or authorized representative who has indicated his/her understanding and acceptance.   Dental advisory given  Plan Discussed with: CRNA  Anesthesia Plan Comments:         Anesthesia Quick Evaluation

## 2016-09-18 NOTE — Consult Note (Signed)
HPI :  50 y/o female with history of HTN and gallstones who presented [facility] last night for abdominal pain. US noted choledocholithiasis and [facility].   She endorses epigastric pain with radiation into her back that started mildly on Thanksgiving, but worsened yesterday into last night. Some nausea but no vomiting. She has been given some pain medication which improved her pain but she remains tender. T bili 1.4-1.5 with ALT 149, stable since last night. WBC 13.4 --> 10.7. Other than HTN no significant medical history. She's had no prior surgeries.   Prior to this most recent episode she reports having had severe RUQ to epigastric pain in October for 4-5 hours which passed on its own, and historically has had some intermittent RUQ / epigastric pain which has bothered her.  Past Medical History:  Diagnosis Date  . Gallstones   . Hypertension      History reviewed. No pertinent surgical history. Family History  Problem Relation Age of Onset  . Breast cancer Mother   . Gallbladder disease Mother   . Parkinson's disease Father   . Heart attack Father 3259  . Stroke Brother 3040   Social History  Substance Use Topics  . Smoking status: Never Smoker  . Smokeless tobacco: Never Used  . Alcohol use Yes     Comment: occ   Current Facility-Administered Medications  Medication Dose Route Frequency Provider Last Rate Last Dose  . 0.9 %  sodium chloride infusion   Intravenous Continuous Alberteen Samhristopher P Danford, MD 125 mL/hr at 09/18/16 0715    . acetaminophen (TYLENOL) tablet 650 mg  650 mg Oral Q6H PRN Alberteen Samhristopher P Danford, MD       Or  . acetaminophen (TYLENOL) suppository 650 mg  650 mg Rectal Q6H PRN Alberteen Samhristopher P Danford, MD      . enoxaparin (LOVENOX) injection 40 mg  40 mg Subcutaneous QHS Alberteen Samhristopher P Danford, MD   40 mg at 09/17/16 2334  . HYDROmorphone (DILAUDID) injection 0.5-1 mg  0.5-1 mg Intravenous Q4H PRN Alberteen Samhristopher P  Danford, MD      . lisinopril (PRINIVIL,ZESTRIL) tablet 5 mg  5 mg Oral Q breakfast Alberteen Samhristopher P Danford, MD      . Norethin Ace-Eth Estrad-FE 1-20 MG-MCG(24) CHEW 1 tablet  1 tablet Oral Daily Alberteen Samhristopher P Danford, MD      . ondansetron (ZOFRAN) tablet 4 mg  4 mg Oral Q6H PRN Alberteen Samhristopher P Danford, MD       Or  . ondansetron (ZOFRAN) injection 4 mg  4 mg Intravenous Q6H PRN Alberteen Samhristopher P Danford, MD      . oxyCODONE (Oxy IR/ROXICODONE) immediate release tablet 5 mg  5 mg Oral Q4H PRN Alberteen Samhristopher P Danford, MD       Allergies  Allergen Reactions  . Sulfamethoxazole Itching and Rash     Review of Systems: All systems reviewed and negative except where noted in HPI.    [place] Abdomen Limited Ruq  Result Date: 09/17/2016 CLINICAL DATA:  Epigastric pain for 1 day EXAM: US ABDOMEN LIMITED - RIGHT UPPER QUADRANT COMPARISON:  09/03/2016 FINDINGS: Gallbladder: Cholelithiasis is noted and well distended gallbladder. No significant gallbladder wall thickening is seen. No pericholecystic fluid is noted. Mild increased tenderness is noted over the gallbladder. Common bile duct: Diameter: 10 mm. There is increased echogenicity noted in the distal common bile duct with significant enlargement when compared with the prior exam consistent with distal common bile duct  stones. Previous common bile duct measurement was 3 mm. Liver: No focal lesion identified. Mild intrahepatic ductal dilatation is seen. IMPRESSION: Cholelithiasis as well as evidence of distal common bile duct stones with significant biliary dilatation when compared with the prior exam. Electronically Signed   By: Alcide CleverMark  Lukens M.D.   On: 09/17/2016 18:32   Lab Results  Component Value Date   WBC 10.7 (H) 09/18/2016   HGB 12.4 09/18/2016   HCT 36.9 09/18/2016   MCV 92.7 09/18/2016   PLT 249 09/18/2016   Lab Results  Component Value Date   ALT 149 (H) 09/18/2016   AST 117 (H) 09/18/2016   ALKPHOS 130 (H) 09/18/2016   BILITOT 1.4 (H)  09/18/2016      Physical Exam: BP (!) 147/77 (BP Location: Right Arm)   Pulse (!) 56   Temp 97.9 F (36.6 C)   Resp 17   Ht 5\' 3"  (1.6 m)   Wt 148 lb (67.1 kg)   LMP 08/25/2016   SpO2 98%   BMI 26.22 kg/m  Constitutional: Pleasant,well-developed, female in no acute distress. HEENT: Normocephalic and atraumatic. Conjunctivae are normal. No scleral icterus. Neck supple.  Cardiovascular: Normal rate, regular rhythm.  Pulmonary/chest: Effort normal and breath sounds normal. No wheezing, rales or rhonchi. Abdominal: Soft, nondistended, some epigastric to RUQ TTP without rebound or guarding. There are no masses palpable. No hepatomegaly. Extremities: no edema Lymphadenopathy: No cervical adenopathy noted. Neurological: Alert and oriented to person place and time. Skin: Skin is warm and dry. No rashes noted. Psychiatric: Normal mood and affect. Behavior is normal.   ASSESSMENT AND PLAN: 50 y/o female with a history of what sounds like biliary colic, presenting with epigastric / RUQ pain, US shows gallstones in the GB with evidence of multiple gallstones within the CBD which is dilated c/w choledocholithiasis. She is stable at this time, but remains tender. LAEs mildly elevated but without downtrend. I think she warrants ERCP to remove choledocholithiasis first, then followed by cholecystectomy. I have discussed risks / benefits of ERCP with her and she wished to proceed.   Dr. Madilyn FiremanHayes is covering biliary endoscopy this weekend. I have discussed her case with him. He will get back to us regarding timing - later today versus tomorrow given weekend OR schedule and his other case load. Please keep NPO for now. Currently afebrile, please add antibiotics should she have any fevers.   Ileene PatrickSteven Armbruster, MD [facility] Pager [phone]

## 2016-09-18 NOTE — Anesthesia Preprocedure Evaluation (Addendum)
Anesthesia Evaluation  Patient identified by MRN, date of birth, ID band Patient awake    Reviewed: Allergy & Precautions, NPO status , Patient's Chart, lab work & pertinent test results  Airway Mallampati: II  TM Distance: >3 FB Neck ROM: Full    Dental  (+) Dental Advisory Given   Pulmonary neg pulmonary ROS,    breath sounds clear to auscultation       Cardiovascular hypertension, Pt. on medications  Rhythm:Regular Rate:Normal     Neuro/Psych negative neurological ROS  negative psych ROS   GI/Hepatic Elevated LFT's Cholelithiasis with Acute/Chronic Cholecystitis Choledocholithiasis S/P ERCP with sphincterotomy and stone extraction yesterday   Endo/Other  negative endocrine ROS  Renal/GU negative Renal ROS  negative genitourinary   Musculoskeletal negative musculoskeletal ROS (+)   Abdominal   Peds  Hematology negative hematology ROS (+)   Anesthesia Other Findings   Reproductive/Obstetrics negative OB ROS                            Lab Results  Component Value Date   WBC 10.7 (H) 09/18/2016   HGB 12.4 09/18/2016   HCT 36.9 09/18/2016   MCV 92.7 09/18/2016   PLT 249 09/18/2016   Lab Results  Component Value Date   CREATININE 0.92 09/18/2016   BUN 14 09/18/2016   NA 139 09/18/2016   K 3.9 09/18/2016   CL 112 (H) 09/18/2016   CO2 22 09/18/2016    Anesthesia Physical  Anesthesia Plan  ASA: II  Anesthesia Plan: General   Post-op Pain Management:    Induction: Intravenous, Cricoid pressure planned and Rapid sequence  Airway Management Planned: Oral ETT  Additional Equipment:   Intra-op Plan:   Post-operative Plan: Extubation in OR  Informed Consent: I have reviewed the patients History and Physical, chart, labs and discussed the procedure including the risks, benefits and alternatives for the proposed anesthesia with the patient or authorized representative who  has indicated his/her understanding and acceptance.   Dental advisory given  Plan Discussed with: CRNA, Anesthesiologist and Surgeon  Anesthesia Plan Comments:        Anesthesia Quick Evaluation

## 2016-09-18 NOTE — Consult Note (Signed)
Reason for Consult:elevated LFTs, dilated common bile duct, gallstones Referring Physician: dr danford  Breanna Harrison is an 50 y.o. female.  HPI: 50 year old Caucasian female with known gallstones and hypertension presented to [facility] [place] yesterday for recurrent worsening epigastric pain associated with nausea. Repeat ultrasound there showed now dilated common bile duct with probable stones and elevated LFTs. She was transferred to [facility] long for definitive care.  She reports a few weeks ago in the middle the night she awoke with epigastric pain that lasted for about 4-5 hours. It radiated to her back. It was associated with nausea. She discussed with her primary care physician who ordered an ultrasound which showed gallstones as well as a mildly elevated lipase. She was subsequently referred to general surgery at [facility]. They discussed gallbladder surgery but elected to wait and continue observation. Patient states that she developed recurrent epigastric pain on Thanksgiving day it was initially mild but worsened throughout the day and evening and finally prompted her to go to the emergency room the following day. She denies any fevers, chills, jaundice or acholic stools. She takes NSAIDs occasionally. She denies any weight loss.  Past Medical History:  Diagnosis Date  . Gallstones   . Hypertension     History reviewed. No pertinent surgical history.  Family History  Problem Relation Age of Onset  . Breast cancer Mother   . Gallbladder disease Mother   . Parkinson's disease Father   . Heart attack Father 28  . Stroke Brother 20    Social History:  reports that she has never smoked. She has never used smokeless tobacco. She reports that she drinks alcohol. She reports that she does not use drugs.  Allergies:  Allergies  Allergen Reactions  . Sulfamethoxazole Itching and Rash    Medications: I have reviewed the patient's current medications.  Results for orders placed  or performed during the hospital encounter of 09/17/16 (from the past 48 hour(s))  Urinalysis, Routine w reflex microscopic (not at [facility])     Status: Abnormal   Collection Time: 09/17/16  3:56 PM  Result Value Ref Range   Color, Urine YELLOW YELLOW   APPearance CLEAR CLEAR   Specific Gravity, Urine 1.007 1.005 - 1.030   pH 8.0 5.0 - 8.0   Glucose, UA NEGATIVE NEGATIVE mg/dL   Hgb urine dipstick NEGATIVE NEGATIVE   Bilirubin Urine NEGATIVE NEGATIVE   Ketones, ur NEGATIVE NEGATIVE mg/dL   Protein, ur NEGATIVE NEGATIVE mg/dL   Nitrite NEGATIVE NEGATIVE   Leukocytes, UA SMALL (A) NEGATIVE  Pregnancy, urine     Status: None   Collection Time: 09/17/16  3:56 PM  Result Value Ref Range   Preg Test, Ur NEGATIVE NEGATIVE    Comment:        THE SENSITIVITY OF THIS METHODOLOGY IS >20 mIU/mL.   Urine microscopic-add on     Status: Abnormal   Collection Time: 09/17/16  3:56 PM  Result Value Ref Range   Squamous Epithelial / LPF 0-5 (A) NONE SEEN   WBC, UA 0-5 0 - 5 WBC/hpf   RBC / HPF NONE SEEN 0 - 5 RBC/hpf   Bacteria, UA MANY (A) NONE SEEN  CBC with Differential     Status: Abnormal   Collection Time: 09/17/16  5:29 PM  Result Value Ref Range   WBC 13.4 (H) 4.0 - 10.5 K/uL   RBC 4.25 3.87 - 5.11 MIL/uL   Hemoglobin 13.2 12.0 - 15.0 g/dL   HCT 39.3 36.0 - 46.0 %  MCV 92.5 78.0 - 100.0 fL   MCH 31.1 26.0 - 34.0 pg   MCHC 33.6 30.0 - 36.0 g/dL   RDW 11.7 11.5 - 15.5 %   Platelets 265 150 - 400 K/uL   Neutrophils Relative % 56 %   Neutro Abs 7.5 1.7 - 7.7 K/uL   Lymphocytes Relative 35 %   Lymphs Abs 4.7 (H) 0.7 - 4.0 K/uL   Monocytes Relative 8 %   Monocytes Absolute 1.1 (H) 0.1 - 1.0 K/uL   Eosinophils Relative 1 %   Eosinophils Absolute 0.1 0.0 - 0.7 K/uL   Basophils Relative 0 %   Basophils Absolute 0.0 0.0 - 0.1 K/uL  Comprehensive metabolic panel     Status: Abnormal   Collection Time: 09/17/16  5:29 PM  Result Value Ref Range   Sodium 139 135 - 145 mmol/L   Potassium  3.6 3.5 - 5.1 mmol/L   Chloride 106 101 - 111 mmol/L   CO2 25 22 - 32 mmol/L   Glucose, Bld 102 (H) 65 - 99 mg/dL   BUN 13 6 - 20 mg/dL   Creatinine, Ser 0.91 0.44 - 1.00 mg/dL   Calcium 8.9 8.9 - 10.3 mg/dL   Total Protein 7.5 6.5 - 8.1 g/dL   Albumin 4.0 3.5 - 5.0 g/dL   AST 106 (H) 15 - 41 U/L   ALT 119 (H) 14 - 54 U/L   Alkaline Phosphatase 129 (H) 38 - 126 U/L   Total Bilirubin 1.5 (H) 0.3 - 1.2 mg/dL   GFR calc non Af Amer >60 >60 mL/min   GFR calc Af Amer >60 >60 mL/min    Comment: (NOTE) The eGFR has been calculated using the CKD EPI equation. This calculation has not been validated in all clinical situations. eGFR's persistently <60 mL/min signify possible Chronic Kidney Disease.    Anion gap 8 5 - 15  Lipase, blood     Status: None   Collection Time: 09/17/16  5:29 PM  Result Value Ref Range   Lipase 23 11 - 51 U/L  CBC     Status: Abnormal   Collection Time: 09/18/16  5:23 AM  Result Value Ref Range   WBC 10.7 (H) 4.0 - 10.5 K/uL   RBC 3.98 3.87 - 5.11 MIL/uL   Hemoglobin 12.4 12.0 - 15.0 g/dL   HCT 36.9 36.0 - 46.0 %   MCV 92.7 78.0 - 100.0 fL   MCH 31.2 26.0 - 34.0 pg   MCHC 33.6 30.0 - 36.0 g/dL   RDW 12.4 11.5 - 15.5 %   Platelets 249 150 - 400 K/uL  Comprehensive metabolic panel     Status: Abnormal   Collection Time: 09/18/16  5:23 AM  Result Value Ref Range   Sodium 139 135 - 145 mmol/L   Potassium 3.9 3.5 - 5.1 mmol/L   Chloride 112 (H) 101 - 111 mmol/L   CO2 22 22 - 32 mmol/L   Glucose, Bld 90 65 - 99 mg/dL   BUN 14 6 - 20 mg/dL   Creatinine, Ser 0.92 0.44 - 1.00 mg/dL   Calcium 8.4 (L) 8.9 - 10.3 mg/dL   Total Protein 6.6 6.5 - 8.1 g/dL   Albumin 3.5 3.5 - 5.0 g/dL   AST 117 (H) 15 - 41 U/L   ALT 149 (H) 14 - 54 U/L   Alkaline Phosphatase 130 (H) 38 - 126 U/L   Total Bilirubin 1.4 (H) 0.3 - 1.2 mg/dL   GFR calc non Af  Amer >60 >60 mL/min   GFR calc Af Amer >60 >60 mL/min    Comment: (NOTE) The eGFR has been calculated using the CKD EPI  equation. This calculation has not been validated in all clinical situations. eGFR's persistently <60 mL/min signify possible Chronic Kidney Disease.    Anion gap 5 5 - 15    US Abdomen Limited Ruq  Result Date: 09/17/2016 CLINICAL DATA:  Epigastric pain for 1 day EXAM: US ABDOMEN LIMITED - RIGHT UPPER QUADRANT COMPARISON:  09/03/2016 FINDINGS: Gallbladder: Cholelithiasis is noted and well distended gallbladder. No significant gallbladder wall thickening is seen. No pericholecystic fluid is noted. Mild increased tenderness is noted over the gallbladder. Common bile duct: Diameter: 10 mm. There is increased echogenicity noted in the distal common bile duct with significant enlargement when compared with the prior exam consistent with distal common bile duct stones. Previous common bile duct measurement was 3 mm. Liver: No focal lesion identified. Mild intrahepatic ductal dilatation is seen. IMPRESSION: Cholelithiasis as well as evidence of distal common bile duct stones with significant biliary dilatation when compared with the prior exam. Electronically Signed   By: Inez Catalina M.D.   On: 09/17/2016 18:32    Review of Systems  Constitutional: Negative for weight loss.  HENT: Negative for nosebleeds.   Eyes: Negative for blurred vision.  Respiratory: Negative for shortness of breath.   Cardiovascular: Negative for chest pain, palpitations, orthopnea and PND.       Denies DOE  Gastrointestinal: Positive for abdominal pain and nausea. Negative for blood in stool and melena.  Genitourinary: Negative for dysuria and hematuria.  Musculoskeletal: Negative.   Skin: Negative for itching and rash.  Neurological: Negative for dizziness, focal weakness, seizures, loss of consciousness and headaches.       Denies TIAs, amaurosis fugax  Endo/Heme/Allergies: Does not bruise/bleed easily.  Psychiatric/Behavioral: The patient is not nervous/anxious.    Blood pressure (!) 147/77, pulse (!) 56,  temperature 97.9 F (36.6 C), resp. rate 17, height '5\' 3"'$  (1.6 m), weight 67.1 kg (148 lb), last menstrual period 08/25/2016, SpO2 98 %. Physical Exam  Vitals reviewed. Constitutional: She is oriented to person, place, and time. She appears well-developed and well-nourished. No distress.  HENT:  Head: Normocephalic and atraumatic.  Right Ear: External ear normal.  Left Ear: External ear normal.  Eyes: Conjunctivae are normal. No scleral icterus.  Neck: Normal range of motion. Neck supple. No tracheal deviation present. No thyromegaly present.  Cardiovascular: Normal rate and normal heart sounds.   Respiratory: Effort normal and breath sounds normal. No stridor. No respiratory distress. She has no wheezes.  GI: Soft. She exhibits no distension. There is tenderness in the epigastric area. There is no rigidity, no rebound and no guarding.    Mild TTP   Musculoskeletal: She exhibits no edema or tenderness.  Lymphadenopathy:    She has no cervical adenopathy.  Neurological: She is alert and oriented to person, place, and time. She exhibits normal muscle tone.  Skin: Skin is warm and dry. No rash noted. She is not diaphoretic. No erythema. No pallor.  Psychiatric: She has a normal mood and affect. Her behavior is normal. Judgment and thought content normal.    Assessment/Plan: Symptomatic cholelithiasis Elevated LFTs Choledocholithiasis Hypertension  Her ultrasound is strongly suspicious for a distal common bile duct stone. I recommend ductal evaluation first. I will defer to GI whether or not they want an MRCP first or go straight to ERCP. Her duct is now dilated compared  to an ultrasound done just 3 weeks ago and her LFTs remain elevated today. Once she has had her duct evaluated and managed I recommended same hospitalization laparoscopic cholecystectomy  I drew diagrams of biliary anatomy and discussed the rationale for this algorithm. We will discuss risk and benefits of surgery at a  later date.  Agree with antibiotics if patient spikes a temperature  We'll follow along  Leighton Ruff. Redmond Pulling, MD, FACS General, Bariatric, & Minimally Invasive Surgery [facility], [place]   [facility] M 09/18/2016, 9:12 AM

## 2016-09-18 NOTE — Anesthesia Procedure Notes (Signed)
Procedure Name: Intubation Date/Time: 09/18/2016 3:28 PM Performed by: Epimenio SarinJARVELA, Breanna Hazelbaker R Pre-anesthesia Checklist: Patient identified, Emergency Drugs available, Suction available, Patient being monitored and Timeout performed Patient Re-evaluated:Patient Re-evaluated prior to inductionOxygen Delivery Method: Circle system utilized Preoxygenation: Pre-oxygenation with 100% oxygen Intubation Type: IV induction Ventilation: Mask ventilation without difficulty Laryngoscope Size: Mac and 3 Grade View: Grade II Tube type: Oral Tube size: 7.5 mm Number of attempts: 1 Airway Equipment and Method: Stylet Placement Confirmation: ETT inserted through vocal cords under direct vision,  positive ETCO2 and breath sounds checked- equal and bilateral Secured at: 22 cm Tube secured with: Tape Dental Injury: Teeth and Oropharynx as per pre-operative assessment  Comments: Downward larygeal pressure to push cords into view/ flip epiglottis upward. ETT passed through cords with firm laryngeal pressure to maintain view.

## 2016-09-18 NOTE — Progress Notes (Signed)
Full note to follow  Dilated duct compared to prior u/s LFTs remain elevated and didn't decrease today   Symptomatic cholelithiasis Elevated LFTs Probable cbd stone(s)  Recommend:  Ductal evaluation first - MRCP vs ERCP - will defer to GI Followed then by surgery for lap chole  Mary SellaEric M. Andrey CampanileWilson, MD, FACS General, Bariatric, & Minimally Invasive Surgery [facility] [place]

## 2016-09-18 NOTE — Op Note (Signed)
[facility] Patient Name: Breanna Harrison Procedure Date: 09/18/2016 MRN: [id number] Attending MD: Barrie Folk , MD Date of Birth: [birth year] CSN: [id number] Age: 50 Admit Type: Inpatient Procedure:                ERCP Indications:              Suspected bile duct stone(s), For therapy of bile                            duct stone(s) Providers:                Everardo All. Madilyn Fireman, MD, Janae Sauce. Steele Berg, RN, Lorenda Ishihara,                            Technician, Harrington Challenger, Technician, Oletha Blend, Pensions consultant Referring MD:              Medicines:                General Anesthesia Complications:            No immediate complications. Estimated Blood Loss:     Estimated blood loss was minimal. Procedure:                Pre-Anesthesia Assessment:                           - Prior to the procedure, a History and Physical                            was performed, and patient medications and                            allergies were reviewed. The patient's tolerance of                            previous anesthesia was also reviewed. The risks                            and benefits of the procedure and the sedation                            options and risks were discussed with the patient.                            All questions were answered, and informed consent                            was obtained. Prior Anticoagulants: The patient has                            taken no previous anticoagulant or antiplatelet                            agents. ASA  Grade Assessment: I - A normal, healthy                            patient. After reviewing the risks and benefits,                            the patient was deemed in satisfactory condition to                            undergo the procedure.                           After obtaining informed consent, the scope was                            passed under direct vision. Throughout the            procedure, the patient's blood pressure, pulse, and                            oxygen saturations were monitored continuously. The                            [id number]) scope was introduced through                            the mouth, and used to inject contrast into and                            used to inject contrast into the bile duct. The                            ERCP was accomplished without difficulty. The                            patient tolerated the procedure well. Findings:      The major papilla was normal. The bile duct was deeply cannulated.       Contrast was injected. I personally interpreted the bile duct images.       There was brisk flow of contrast through the ducts. Image quality was       excellent. Contrast extended to the main bile duct. Opacification of the       main bile duct was successful. The maximum diameter of the ducts was 9       mm. The main bile duct contained multiple stones, the largest of which       was 7 mm in diameter. The biliary tree was otherwise normal. A wire was       passed into the biliary tree. An 8 mm biliary sphincterotomy was made       with a traction (standard) sphincterotome using ERBE electrocautery.       There was no post-sphincterotomy bleeding. The biliary tree was swept       with a 12 mm balloon starting at the bifurcation. 8-10 stones were       removed. No stones remained. Impression:               -  The major papilla appeared normal.                           - Choledocholithiasis was found. Complete removal                            was accomplished by biliary sphincterotomy and                            balloon extraction.                           - A biliary sphincterotomy was performed.                           - The biliary tree was swept. Moderate Sedation:      Moderate (conscious) sedation was. [Parameters Monitored]. [Procedure       Duration Time]. Recommendation:           - Refer to  a surgeon today.                           - Observe patient's clinical course.                           - Check liver enzymes (AST, ALT, alkaline                            phosphatase, bilirubin) in the morning. Procedure Code(s):        --- Professional ---                           [id number], Endoscopic retrograde                            cholangiopancreatography (ERCP); with removal of                            calculi/debris from biliary/pancreatic duct(s)                           43262, Endoscopic retrograde                            cholangiopancreatography (ERCP); with                            sphincterotomy/papillotomy Diagnosis Code(s):        --- Professional ---                           K80.50, Calculus of bile duct without cholangitis                            or cholecystitis without obstruction CPT copyright 2016 American Medical Association. All rights reserved. The codes documented in this report are preliminary and upon coder review may  be revised to meet current compliance requirements. Barrie FolkJohn C Jolita Haefner, MD  09/18/2016 4:13:44 PM This report has been signed electronically. Number of Addenda: 0

## 2016-09-18 NOTE — Anesthesia Postprocedure Evaluation (Signed)
Anesthesia Post Note  Patient: Breanna AlstromSusan Harrison  Procedure(s) Performed: Procedure(s) (LRB): ENDOSCOPIC RETROGRADE CHOLANGIOPANCREATOGRAPHY (ERCP) (N/A)  Patient location during evaluation: PACU Anesthesia Type: General Level of consciousness: awake and alert Pain management: pain level controlled Vital Signs Assessment: post-procedure vital signs reviewed and stable Respiratory status: spontaneous breathing, nonlabored ventilation, respiratory function stable and patient connected to nasal cannula oxygen Cardiovascular status: blood pressure returned to baseline and stable Postop Assessment: no signs of nausea or vomiting Anesthetic complications: no    Last Vitals:  Vitals:   09/18/16 1650 09/18/16 1655  BP: (!) 147/68   Pulse: 76 77  Resp: 15 16  Temp:      Last Pain:  Vitals:   09/18/16 1622  TempSrc: Oral  PainSc:                  Kennieth RadFitzgerald, Lailah Marcelli E

## 2016-09-18 NOTE — Transfer of Care (Signed)
Immediate Anesthesia Transfer of Care Note  Patient: Breanna AlstromSusan Lahm  Procedure(s) Performed: Procedure(s): ENDOSCOPIC RETROGRADE CHOLANGIOPANCREATOGRAPHY (ERCP) (N/A)  Patient Location: PACU  Anesthesia Type:General  Level of Consciousness:  sedated, patient cooperative and responds to stimulation  Airway & Oxygen Therapy:Patient Spontanous Breathing and Patient connected to face mask oxgen  Post-op Assessment:  Report given to PACU RN and Post -op Vital signs reviewed and stable  Post vital signs:  Reviewed and stable  Last Vitals:  Vitals:   09/18/16 1422 09/18/16 1622  BP: (!) 167/87 133/83  Pulse: 83 82  Resp: 15 13  Temp: 37.1 C 36.7 C    Complications: No apparent anesthesia complications

## 2016-09-19 ENCOUNTER — Encounter (HOSPITAL_COMMUNITY)
Admission: EM | Disposition: A | Discharge status: Home / Self Care | Payer: Self-pay | Source: Home / Self Care | Attending: Internal Medicine

## 2016-09-19 ENCOUNTER — Inpatient Hospital Stay (HOSPITAL_COMMUNITY): Payer: BLUE CROSS/BLUE SHIELD | Admitting: Anesthesiology

## 2016-09-19 ENCOUNTER — Encounter (HOSPITAL_COMMUNITY): Payer: Self-pay | Admitting: Anesthesiology

## 2016-09-19 ENCOUNTER — Inpatient Hospital Stay (HOSPITAL_COMMUNITY): Payer: BLUE CROSS/BLUE SHIELD

## 2016-09-19 DIAGNOSIS — I10 Essential (primary) hypertension: Secondary | ICD-10-CM

## 2016-09-19 DIAGNOSIS — R740 Nonspecific elevation of levels of transaminase and lactic acid dehydrogenase [LDH]: Secondary | ICD-10-CM

## 2016-09-19 DIAGNOSIS — R7401 Elevation of levels of liver transaminase levels: Secondary | ICD-10-CM

## 2016-09-19 HISTORY — PX: CHOLECYSTECTOMY: SHX55

## 2016-09-19 LAB — SURGICAL PCR SCREEN
MRSA, PCR: NEGATIVE
Staphylococcus aureus: NEGATIVE

## 2016-09-19 LAB — COMPREHENSIVE METABOLIC PANEL
ALT: 100 U/L — ABNORMAL HIGH (ref 14–54)
ANION GAP: 7 (ref 5–15)
AST: 46 U/L — AB (ref 15–41)
Albumin: 3.3 g/dL — ABNORMAL LOW (ref 3.5–5.0)
Alkaline Phosphatase: 115 U/L (ref 38–126)
BILIRUBIN TOTAL: 0.9 mg/dL (ref 0.3–1.2)
BUN: 10 mg/dL (ref 6–20)
CO2: 18 mmol/L — ABNORMAL LOW (ref 22–32)
Calcium: 8.2 mg/dL — ABNORMAL LOW (ref 8.9–10.3)
Chloride: 114 mmol/L — ABNORMAL HIGH (ref 101–111)
Creatinine, Ser: 0.72 mg/dL (ref 0.44–1.00)
GFR calc Af Amer: >60 mL/min (ref >60)
Glucose, Bld: 103 mg/dL — ABNORMAL HIGH (ref 65–99)
POTASSIUM: 4 mmol/L (ref 3.5–5.1)
Sodium: 139 mmol/L (ref 135–145)
TOTAL PROTEIN: 6.1 g/dL — AB (ref 6.5–8.1)

## 2016-09-19 SURGERY — LAPAROSCOPIC CHOLECYSTECTOMY WITH INTRAOPERATIVE CHOLANGIOGRAM
Anesthesia: General | Site: Abdomen

## 2016-09-19 MED ORDER — SCOPOLAMINE 1 MG/3DAYS TD PT72: 1.0000 | MEDICATED_PATCH | TRANSDERMAL | Status: DC

## 2016-09-19 MED ORDER — ONDANSETRON HCL 4 MG/2ML IJ SOLN
INTRAMUSCULAR | Status: AC
  Filled 2016-09-19: qty 2

## 2016-09-19 MED ORDER — CEFAZOLIN SODIUM-DEXTROSE 2-4 GM/100ML-% IV SOLN
INTRAVENOUS | Status: AC
  Filled 2016-09-19: qty 100

## 2016-09-19 MED ORDER — LACTATED RINGERS IV SOLN
INTRAVENOUS | Status: DC | PRN
  Administered 2016-09-19: 07:00:00 via INTRAVENOUS

## 2016-09-19 MED ORDER — ACETAMINOPHEN 500 MG PO TABS: 1000.0000 mg | ORAL_TABLET | ORAL | Status: DC

## 2016-09-19 MED ORDER — OXYCODONE HCL 5 MG PO TABS
5.0000 mg | ORAL_TABLET | ORAL | Status: DC | PRN
  Administered 2016-09-20: 5 mg via ORAL
  Filled 2016-09-19: qty 1

## 2016-09-19 MED ORDER — HYDROMORPHONE HCL 1 MG/ML IJ SOLN: 0.2500 mg | INTRAMUSCULAR | Status: DC | PRN

## 2016-09-19 MED ORDER — HYDROMORPHONE HCL 1 MG/ML IJ SOLN
0.5000 mg | INTRAMUSCULAR | Status: DC | PRN
  Administered 2016-09-19 – 2016-09-20 (×3): 1 mg via INTRAVENOUS
  Filled 2016-09-19 (×3): qty 1

## 2016-09-19 MED ORDER — IOPAMIDOL (ISOVUE-300) INJECTION 61%
INTRAVENOUS | Status: AC
  Filled 2016-09-19: qty 50

## 2016-09-19 MED ORDER — CHLORHEXIDINE GLUCONATE 4 % EX LIQD
1.0000 "application " | Freq: Once | CUTANEOUS | Status: DC
  Filled 2016-09-19: qty 15

## 2016-09-19 MED ORDER — BUPIVACAINE HCL (PF) 0.25 % IJ SOLN
INTRAMUSCULAR | Status: DC | PRN
  Administered 2016-09-19: 20 mL

## 2016-09-19 MED ORDER — PROPOFOL 10 MG/ML IV BOLUS
INTRAVENOUS | Status: AC
  Filled 2016-09-19: qty 20

## 2016-09-19 MED ORDER — 0.9 % SODIUM CHLORIDE (POUR BTL) OPTIME
TOPICAL | Status: DC | PRN
  Administered 2016-09-19: 1000 mL

## 2016-09-19 MED ORDER — SODIUM CHLORIDE 0.9 % IV SOLN
INTRAVENOUS | Status: DC
  Administered 2016-09-19 – 2016-09-20 (×3): via INTRAVENOUS

## 2016-09-19 MED ORDER — IOPAMIDOL (ISOVUE-300) INJECTION 61%
INTRAVENOUS | Status: DC | PRN
  Administered 2016-09-19: 50 mL via INTRAVENOUS

## 2016-09-19 MED ORDER — CELECOXIB 200 MG PO CAPS: 400.0000 mg | ORAL_CAPSULE | ORAL | Status: DC

## 2016-09-19 MED ORDER — ROCURONIUM BROMIDE 10 MG/ML (PF) SYRINGE
PREFILLED_SYRINGE | INTRAVENOUS | Status: DC | PRN
  Administered 2016-09-19: 30 mg via INTRAVENOUS
  Administered 2016-09-19 (×2): 10 mg via INTRAVENOUS

## 2016-09-19 MED ORDER — LIDOCAINE 2% (20 MG/ML) 5 ML SYRINGE
INTRAMUSCULAR | Status: AC
  Filled 2016-09-19: qty 5

## 2016-09-19 MED ORDER — ONDANSETRON HCL 4 MG/2ML IJ SOLN
INTRAMUSCULAR | Status: DC | PRN
  Administered 2016-09-19: 4 mg via INTRAVENOUS

## 2016-09-19 MED ORDER — FENTANYL CITRATE (PF) 100 MCG/2ML IJ SOLN
INTRAMUSCULAR | Status: DC | PRN
  Administered 2016-09-19: 50 ug via INTRAVENOUS
  Administered 2016-09-19 (×2): 25 ug via INTRAVENOUS
  Administered 2016-09-19: 50 ug via INTRAVENOUS
  Administered 2016-09-19: 150 ug via INTRAVENOUS
  Administered 2016-09-19: 50 ug via INTRAVENOUS

## 2016-09-19 MED ORDER — FENTANYL CITRATE (PF) 250 MCG/5ML IJ SOLN
INTRAMUSCULAR | Status: AC
  Filled 2016-09-19: qty 5

## 2016-09-19 MED ORDER — MEPERIDINE HCL 50 MG/ML IJ SOLN: 6.2500 mg | INTRAMUSCULAR | Status: DC | PRN

## 2016-09-19 MED ORDER — BUPIVACAINE LIPOSOME 1.3 % IJ SUSP
20.0000 mL | Freq: Once | INTRAMUSCULAR | Status: DC
  Filled 2016-09-19: qty 20

## 2016-09-19 MED ORDER — KETOROLAC TROMETHAMINE 30 MG/ML IJ SOLN
INTRAMUSCULAR | Status: AC
  Filled 2016-09-19: qty 1

## 2016-09-19 MED ORDER — FENTANYL CITRATE (PF) 100 MCG/2ML IJ SOLN
INTRAMUSCULAR | Status: AC
  Filled 2016-09-19: qty 2

## 2016-09-19 MED ORDER — LIDOCAINE 2% (20 MG/ML) 5 ML SYRINGE
INTRAMUSCULAR | Status: DC | PRN
  Administered 2016-09-19: 100 mg via INTRAVENOUS

## 2016-09-19 MED ORDER — METOCLOPRAMIDE HCL 5 MG/ML IJ SOLN
10.0000 mg | Freq: Once | INTRAMUSCULAR | Status: DC | PRN
Start: 2016-09-19 — End: 2016-09-19

## 2016-09-19 MED ORDER — SUCCINYLCHOLINE CHLORIDE 200 MG/10ML IV SOSY
PREFILLED_SYRINGE | INTRAVENOUS | Status: AC
  Filled 2016-09-19: qty 10

## 2016-09-19 MED ORDER — SODIUM CHLORIDE 0.9 % IJ SOLN
INTRAMUSCULAR | Status: AC
  Filled 2016-09-19: qty 50

## 2016-09-19 MED ORDER — CEFAZOLIN SODIUM-DEXTROSE 2-4 GM/100ML-% IV SOLN
2.0000 g | INTRAVENOUS | Status: AC
  Administered 2016-09-19: 2 g via INTRAVENOUS
  Filled 2016-09-19: qty 100

## 2016-09-19 MED ORDER — SUCCINYLCHOLINE CHLORIDE 200 MG/10ML IV SOSY
PREFILLED_SYRINGE | INTRAVENOUS | Status: DC | PRN
  Administered 2016-09-19: 100 mg via INTRAVENOUS

## 2016-09-19 MED ORDER — DEXAMETHASONE SODIUM PHOSPHATE 10 MG/ML IJ SOLN
INTRAMUSCULAR | Status: AC
  Filled 2016-09-19: qty 1

## 2016-09-19 MED ORDER — SUGAMMADEX SODIUM 200 MG/2ML IV SOLN
INTRAVENOUS | Status: DC | PRN
  Administered 2016-09-19: 150 mg via INTRAVENOUS

## 2016-09-19 MED ORDER — KETOROLAC TROMETHAMINE 30 MG/ML IJ SOLN
INTRAMUSCULAR | Status: DC | PRN
  Administered 2016-09-19: 30 mg via INTRAVENOUS

## 2016-09-19 MED ORDER — BUPIVACAINE HCL (PF) 0.25 % IJ SOLN
INTRAMUSCULAR | Status: AC
  Filled 2016-09-19: qty 30

## 2016-09-19 MED ORDER — ROCURONIUM BROMIDE 50 MG/5ML IV SOSY
PREFILLED_SYRINGE | INTRAVENOUS | Status: AC
  Filled 2016-09-19: qty 5

## 2016-09-19 MED ORDER — DEXAMETHASONE SODIUM PHOSPHATE 10 MG/ML IJ SOLN
INTRAMUSCULAR | Status: DC | PRN
  Administered 2016-09-19: 10 mg via INTRAVENOUS

## 2016-09-19 MED ORDER — LACTATED RINGERS IR SOLN
Status: DC | PRN
  Administered 2016-09-19: 3000 mL

## 2016-09-19 MED ORDER — PROPOFOL 10 MG/ML IV BOLUS
INTRAVENOUS | Status: DC | PRN
  Administered 2016-09-19: 120 mg via INTRAVENOUS

## 2016-09-19 MED ORDER — GABAPENTIN 300 MG PO CAPS: 300.0000 mg | ORAL_CAPSULE | ORAL | Status: DC

## 2016-09-19 MED ORDER — SCOPOLAMINE 1 MG/3DAYS TD PT72
MEDICATED_PATCH | TRANSDERMAL | Status: AC
  Filled 2016-09-19: qty 1

## 2016-09-19 SURGICAL SUPPLY — 52 items
APPLICATOR ARISTA FLEXITIP XL (MISCELLANEOUS) IMPLANT
APPLIER CLIP 5 13 M/L LIGAMAX5 (MISCELLANEOUS) ×3
APPLIER CLIP ROT 10 11.4 M/L (STAPLE)
BANDAGE ADH SHEER 1  50/CT (GAUZE/BANDAGES/DRESSINGS) ×12 IMPLANT
BENZOIN TINCTURE PRP APPL 2/3 (GAUZE/BANDAGES/DRESSINGS) ×3 IMPLANT
CABLE HIGH FREQUENCY MONO STRZ (ELECTRODE) ×3 IMPLANT
CHLORAPREP W/TINT 26ML (MISCELLANEOUS) ×3 IMPLANT
CLIP APPLIE 5 13 M/L LIGAMAX5 (MISCELLANEOUS) ×1 IMPLANT
CLIP APPLIE ROT 10 11.4 M/L (STAPLE) IMPLANT
CLOSURE STERI-STRIP 1/4X4 (GAUZE/BANDAGES/DRESSINGS) IMPLANT
CLOSURE WOUND 1/2 X4 (GAUZE/BANDAGES/DRESSINGS) ×1
COVER MAYO STAND STRL (DRAPES) ×3 IMPLANT
COVER SURGICAL LIGHT HANDLE (MISCELLANEOUS) IMPLANT
DECANTER SPIKE VIAL GLASS SM (MISCELLANEOUS) ×3 IMPLANT
DEVICE PMI PUNCTURE CLOSURE (MISCELLANEOUS) ×3 IMPLANT
DRAPE C-ARM 42X120 X-RAY (DRAPES) ×3 IMPLANT
DRSG TEGADERM 2-3/8X2-3/4 SM (GAUZE/BANDAGES/DRESSINGS) IMPLANT
DRSG TEGADERM 4X4.75 (GAUZE/BANDAGES/DRESSINGS) ×3 IMPLANT
ELECT L-HOOK LAP 45CM DISP (ELECTROSURGICAL) ×3
ELECT PENCIL ROCKER SW 15FT (MISCELLANEOUS) ×3 IMPLANT
ELECT REM PT RETURN 9FT ADLT (ELECTROSURGICAL) ×3
ELECTRODE L-HOOK LAP 45CM DISP (ELECTROSURGICAL) ×1 IMPLANT
ELECTRODE REM PT RTRN 9FT ADLT (ELECTROSURGICAL) ×1 IMPLANT
GAUZE SPONGE 2X2 8PLY STRL LF (GAUZE/BANDAGES/DRESSINGS) ×1 IMPLANT
GAUZE SPONGE 4X4 16PLY XRAY LF (GAUZE/BANDAGES/DRESSINGS) ×3 IMPLANT
GLOVE BIO SURGEON STRL SZ7.5 (GLOVE) ×3 IMPLANT
GLOVE INDICATOR 8.0 STRL GRN (GLOVE) ×3 IMPLANT
GOWN STRL REUS W/TWL XL LVL3 (GOWN DISPOSABLE) ×9 IMPLANT
HEMOSTAT ARISTA ABSORB 3G PWDR (MISCELLANEOUS) IMPLANT
HEMOSTAT SNOW SURGICEL 2X4 (HEMOSTASIS) IMPLANT
IRRIG SUCT STRYKERFLOW 2 WTIP (MISCELLANEOUS) ×3
IRRIGATION SUCT STRKRFLW 2 WTP (MISCELLANEOUS) ×1 IMPLANT
KIT BASIN OR (CUSTOM PROCEDURE TRAY) ×3 IMPLANT
L-HOOK LAP DISP 36CM (ELECTROSURGICAL)
LHOOK LAP DISP 36CM (ELECTROSURGICAL) IMPLANT
PACK GENERAL/GYN (CUSTOM PROCEDURE TRAY) ×3 IMPLANT
POUCH RETRIEVAL ECOSAC 10 (ENDOMECHANICALS) ×2 IMPLANT
POUCH RETRIEVAL ECOSAC 10MM (ENDOMECHANICALS) ×4
SCISSORS LAP 5X35 DISP (ENDOMECHANICALS) ×3 IMPLANT
SET CHOLANGIOGRAPH MIX (MISCELLANEOUS) ×3 IMPLANT
SLEEVE XCEL OPT CAN 5 100 (ENDOMECHANICALS) ×6 IMPLANT
SPONGE GAUZE 2X2 STER 10/PKG (GAUZE/BANDAGES/DRESSINGS) ×2
STRIP CLOSURE SKIN 1/2X4 (GAUZE/BANDAGES/DRESSINGS) ×2 IMPLANT
SUT MNCRL AB 4-0 PS2 18 (SUTURE) ×3 IMPLANT
SUT VICRYL 0 UR6 27IN ABS (SUTURE) ×6 IMPLANT
TOWEL OR 17X26 10 PK STRL BLUE (TOWEL DISPOSABLE) ×3 IMPLANT
TOWEL OR NON WOVEN STRL DISP B (DISPOSABLE) IMPLANT
TRAY LAPAROSCOPIC (CUSTOM PROCEDURE TRAY) IMPLANT
TROCAR BLADELESS OPT 5 100 (ENDOMECHANICALS) ×3 IMPLANT
TROCAR XCEL BLUNT TIP 100MML (ENDOMECHANICALS) ×3 IMPLANT
TROCAR XCEL NON-BLD 11X100MML (ENDOMECHANICALS) IMPLANT
TUBING INSUF HEATED (TUBING) ×3 IMPLANT

## 2016-09-19 NOTE — Interval H&P Note (Signed)
History and Physical Interval Note:  09/19/2016 7:24 AM  Breanna Harrison  has presented today for surgery, with the diagnosis of Cholocystieis  The various methods of treatment have been discussed with the patient and family. After consideration of risks, benefits and other options for treatment, the patient has consented to  Procedure(s): LAPAROSCOPIC CHOLECYSTECTOMY WITH INTRAOPERATIVE CHOLANGIOGRAM (N/A) as a surgical intervention .  The patient's history has been reviewed, patient examined, no change in status, stable for surgery.  I have reviewed the patient's chart and labs.  Questions were answered to the patient's satisfaction.    I believe the patient's symptoms are consistent with gallbladder disease.  We discussed gallbladder disease. The patient was given Agricultural engineereducational material. We discussed non-operative and operative management. We discussed the signs & symptoms of acute cholecystitis  I discussed laparoscopic cholecystectomy with IOC in detail.  The patient was shown diagrams detailing the procedure.  We discussed the risks and benefits of a laparoscopic cholecystectomy including, but not limited to bleeding, infection, injury to surrounding structures such as the intestine or liver, bile leak, retained gallstones, need to convert to an open procedure, prolonged diarrhea, blood clots such as  DVT, common bile duct injury, anesthesia risks, and possible need for additional procedures.  We discussed the typical post-operative recovery course. I explained that the likelihood of improvement of their symptoms is good.  Mary SellaEric M. Andrey CampanileWilson, MD, FACS General, Bariatric, & Minimally Invasive Surgery [facility], [place]    [facility] IncWILSON,Cara Thaxton M

## 2016-09-19 NOTE — Progress Notes (Signed)
Progress Note   Subjective  Patient had ERCP yesterday with several gallstones removed. She had cholecystectomy today per Dr. Andrey CampanileWilson, filling defect noted on IOC concerning for retained gallstone. Patient reports some soreness after the operation but otherwise feels well.    Objective   Vital signs in last 24 hours: Temp:  [97.6 F (36.4 C)-98.9 F (37.2 C)] 98.5 F (36.9 C) (11/26 1152) Pulse Rate:  [61-88] 70 (11/26 1152) Resp:  [9-16] 16 (11/26 1152) BP: (103-167)/(50-93) 148/75 (11/26 1152) SpO2:  [98 %-100 %] 100 % (11/26 1152) Weight:  [148 lb (67.1 kg)] 148 lb (67.1 kg) (11/25 1422) Last BM Date: 09/17/16 General:    white female in NAD Heart:  Regular rate and rhythm; no murmurs Lungs: Respirations even and unlabored, lungs CTA bilaterally Abdomen:  Soft, appropriate tenderness. Extremities:  Without edema. Neurologic:  Alert and oriented,  grossly normal neurologically. Psych:  Cooperative. Normal mood and affect.  Intake/Output from previous day: 11/25 0701 - 11/26 0700 In: 3960 [P.O.:360; I.V.:3600] Out: 1555 [Urine:1550; Blood:5] Intake/Output this shift: Total I/O In: 985 [P.O.:60; I.V.:925] Out: 50 [Blood:50]  Lab Results:  Recent Labs  09/17/16 1729 09/18/16 0523  WBC 13.4* 10.7*  HGB 13.2 12.4  HCT 39.3 36.9  PLT 265 249   BMET  Recent Labs  09/17/16 1729 09/18/16 0523 09/19/16 0453  NA 139 139 139  K 3.6 3.9 4.0  CL 106 112* 114*  CO2 25 22 18*  GLUCOSE 102* 90 103*  BUN 13 14 10   CREATININE 0.91 0.92 0.72  CALCIUM 8.9 8.4* 8.2*   LFT  Recent Labs  09/19/16 0453  PROT 6.1*  ALBUMIN 3.3*  AST 46*  ALT 100*  ALKPHOS 115  BILITOT 0.9   PT/INR No results for input(s): LABPROT, INR in the last 72 hours.  Studies/Results: Dg Cholangiogram Operative  Result Date: 09/19/2016 CLINICAL DATA:  50 year old female undergoing intraoperative cholangiogram during cholecystectomy for cholelithiasis. EXAM: INTRAOPERATIVE  CHOLANGIOGRAM TECHNIQUE: Cholangiographic images from the C-arm fluoroscopic device were submitted for interpretation post-operatively. Please see the procedural report for the amount of contrast and the fluoroscopy time utilized. COMPARISON:  ERCP performed yesterday. FINDINGS: Cine clip performed at the time of laparoscopic cholecystectomy and intraoperative cholangiogram demonstrates cannulation of the cystic duct remanent and opacification of the biliary tree. There is a single mobile filling defect in the distal common bile duct consistent with choledocholithiasis. The ampulla remains patent. Contrast material passes freely through the ampulla and into the duodenum. IMPRESSION: 1. Single mobile filling defect in the distal common bile duct consistent with choledocholithiasis. 2. The ampulla remains patent. Electronically Signed   By: Malachy MoanHeath  McCullough M.D.   On: 09/19/2016 09:22   Dg Ercp Biliary & Pancreatic Ducts  Result Date: 09/19/2016 CLINICAL DATA:  50 year old female with cholelithiasis and choledocholithiasis EXAM: ERCP TECHNIQUE: Multiple spot images obtained with the fluoroscopic device and submitted for interpretation post-procedure. FLUOROSCOPY TIME:  Fluoroscopy Time:  8 minutes 47 seconds reported COMPARISON:  Right upper quadrant ultrasound 09/17/2016 FINDINGS: Total of 5 intraoperative spot images and cine clips demonstrate a flexible endoscope in the descending duodenum followed by cannulation of the common bile duct. Contrast opacification demonstrates a dilated common bile duct with numerous faceted filling defects consistent with extensive choledocholithiasis. The cystic duct is patent. Contrast material opacifies the gallbladder which is filled with small faceted stones. Subsequent images demonstrate sphincterotomy and balloon sweep of the common bile duct. IMPRESSION: 1. Cholelithiasis and choledocholithiasis. 2. Sphincterotomy and balloon  sweeping of the common duct. These images  were submitted for radiologic interpretation only. Please see the procedural report for the amount of contrast and the fluoroscopy time utilized. Electronically Signed   By: Malachy MoanHeath  McCullough M.D.   On: 09/19/2016 08:04   [place] Abdomen Limited Ruq  Result Date: 09/17/2016 CLINICAL DATA:  Epigastric pain for 1 day EXAM: US ABDOMEN LIMITED - RIGHT UPPER QUADRANT COMPARISON:  09/03/2016 FINDINGS: Gallbladder: Cholelithiasis is noted and well distended gallbladder. No significant gallbladder wall thickening is seen. No pericholecystic fluid is noted. Mild increased tenderness is noted over the gallbladder. Common bile duct: Diameter: 10 mm. There is increased echogenicity noted in the distal common bile duct with significant enlargement when compared with the prior exam consistent with distal common bile duct stones. Previous common bile duct measurement was 3 mm. Liver: No focal lesion identified. Mild intrahepatic ductal dilatation is seen. IMPRESSION: Cholelithiasis as well as evidence of distal common bile duct stones with significant biliary dilatation when compared with the prior exam. Electronically Signed   By: Alcide CleverMark  Lukens M.D.   On: 09/17/2016 18:32       Assessment / Plan:   50 y/o female with a history of biliary colic, who presented with epigastric / RUQ pain, US showed gallstones in the GB with choledocholithiasis. She underwent ERCP with Dr. Madilyn FiremanHayes yesterday in which multiple gallstones were removed from the CBD. She had cholecystectomy today with IOC showing a retained gallstone at the distal CBD. This stone was nonobstructive but sizable. She is otherwise feeling fairly well, having expected post surgical mild tenderness.   Will watch her today, recheck LAEs in the AM. It's possible she could pass this retained stone following sphincterotomy, but suspect she will more than likely need ERCP given its size. I have her tentatively schedule for ERCP tomorrow with Dr. Marina GoodellPerry. Patient and family agree  with plan. NPO after MN. Dr. Marina GoodellPerry is assuming the GI service and will reassess her tomorrow.   Ileene PatrickSteven Armbruster, MD [facility] Pager [phone]

## 2016-09-19 NOTE — Op Note (Signed)
Breanna Harrison [id number] 08/12/1966 09/19/2016  Laparoscopic Cholecystectomy with IOC Procedure Note  Indications: This patient presents with symptomatic gallbladder disease and will undergo laparoscopic cholecystectomy. She presented with choledocholithiasis and underwent ERCP with sphincterotomy yesterday by gastroenterology with extraction of 8 stones from her common bile duct. Today she is brought to the operating room for laparoscopic cholecystectomy. 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 The risks, benefits, complications, treatment options, and expected outcomes were discussed with the patient. The possibilities of reaction to medication, pulmonary aspiration, perforation of viscus, bleeding, recurrent infection, finding a normal gallbladder, the need for additional procedures, failure to diagnose a condition, the possible need to convert to an open procedure, and creating a complication requiring transfusion or operation were discussed with the patient. The likelihood of improving the patient's symptoms with return to their baseline status is good.  The patient and/or family concurred with the proposed plan, giving informed consent. The site of surgery properly noted. The patient was taken to Operating Room, identified as Breanna Harrison and the procedure verified as Laparoscopic Cholecystectomy with Intraoperative Cholangiogram. A Time Out was held and the above information confirmed. Antibiotic prophylaxis was administered.   Prior to the induction of general anesthesia, antibiotic  prophylaxis was administered. General endotracheal anesthesia was then administered and tolerated well. After the induction, the abdomen was prepped with Chloraprep and draped in the sterile fashion. The patient was positioned in the supine position.  Local anesthetic agent was injected into the skin near the umbilicus and an incision made. We dissected down to the abdominal fascia with blunt dissection.  The fascia was incised vertically and we entered the peritoneal cavity bluntly.  A pursestring suture of 0-Vicryl was placed around the fascial opening.  The Hasson cannula was inserted and secured with the stay suture.  Pneumoperitoneum was then created with CO2 and tolerated well without any adverse changes in the patient's vital signs. An 5-mm port was placed in the subxiphoid position.  Two 5-mm ports were placed in the right upper quadrant. All skin incisions were infiltrated with a local anesthetic agent before making the incision and placing the trocars.   We positioned the patient in reverse Trendelenburg, tilted slightly to the patient's left.  The gallbladder was identified, the fundus grasped and retracted cephalad. Adhesions were lysed bluntly and with the electrocautery where indicated, taking care not to injure any adjacent organs or viscus. The infundibulum was slightly folded over the cystic duct. I incised the peritoneum overlying the infundibulum with hook electrocautery and this freed up the infundibulum. The infundibulum was grasped and retracted laterally, exposing the peritoneum overlying the triangle of Calot. This was then divided and exposed in a blunt fashion. A critical view of the cystic duct and cystic artery was obtained.  The cystic duct was clearly identified and bluntly dissected circumferentially. The cystic duct was ligated with a clip distally.  The cystic duct was dilated. An incision was made in the cystic duct and the [facility] cholangiogram catheter introduced. The catheter  was secured using a clip. A cholangiogram was then obtained which showed good visualization of the distal and proximal biliary tree with no sign of obstruction.  Contrast flowed easily into  the duodenum. However there appeared to be a stone in the distal common bile duct. There was excellent drainage of contrast into the small bowel and she had a sphincterotomy yesterday so I decided not to proceed with common bile duct exploration. The catheter was then removed.   The cystic duct was then ligated with clips and divided. The cystic artery which had been identified & dissected free was ligated with clips and divided as well.   The gallbladder was dissected from the liver bed in retrograde fashion with the electrocautery. The gallbladder was removed and placed in an Ecco sac.  The gallbladder and Ecco sac were then removed through the umbilical port site. The liver bed was irrigated and inspected. Hemostasis was achieved with the electrocautery. Copious irrigation was utilized and was repeatedly aspirated until clear.  The pursestring suture was used to close the umbilical fascia.  Even though there was no air leak, the fascia felt a little bit weak therefore I placed an additional single interrupted 0 Vicryl suture using the PMI suture passer. This felt much better.   We again inspected the right upper quadrant for hemostasis.  The umbilical closure was inspected and there was no air leak and nothing trapped within the closure. Pneumoperitoneum was released as we removed the trocars.  4-0 Monocryl was used to close the skin.   Benzoin, steri-strips, and clean dressings were applied. The patient was then extubated and brought to the recovery room in stable condition. Instrument, sponge, and needle counts were correct at closure and at the conclusion of the case.   Findings: Chronic Cholecystitis with Cholelithiasis with choledocholithiasis without obstruction  Estimated Blood Loss: Minimal         Drains:  None         Specimens: Gallbladder           Complications: None; patient tolerated the procedure well.         Disposition: PACU - hemodynamically stable.         Condition: stable  Mary SellaEric M. Andrey CampanileWilson, MD, FACS General, Bariatric, & Minimally Invasive Surgery [facility] [place]

## 2016-09-19 NOTE — Anesthesia Postprocedure Evaluation (Signed)
Anesthesia Post Note  Patient: Breanna AlstromSusan Harrison  Procedure(s) Performed: Procedure(s) (LRB): LAPAROSCOPIC CHOLECYSTECTOMY WITH INTRAOPERATIVE CHOLANGIOGRAM (N/A)  Patient location during evaluation: PACU Anesthesia Type: General Level of consciousness: awake and alert and oriented Pain management: pain level controlled Vital Signs Assessment: post-procedure vital signs reviewed and stable Respiratory status: spontaneous breathing, nonlabored ventilation and respiratory function stable Cardiovascular status: blood pressure returned to baseline and stable Postop Assessment: no signs of nausea or vomiting Anesthetic complications: no    Last Vitals:  Vitals:   09/19/16 0945 09/19/16 1000  BP: (!) 142/84 (!) 149/81  Pulse: 73 70  Resp: 12 12  Temp: 36.8 C 36.7 C    Last Pain:  Vitals:   09/19/16 0550  TempSrc: Oral  PainSc:                  Dalinda Heidt A.

## 2016-09-19 NOTE — Progress Notes (Signed)
PROGRESS NOTE                                                                                                                                                                                                             Patient Demographics:    Breanna Harrison, is a 50 y.o. female, DOB - 12/16/1965, ZOX:[id number]  Admit date - 09/17/2016   Admitting Physician Alberteen Samhristopher P Danford, MD  Outpatient Primary MD for the patient is HUNTER, [name], FNP  LOS - 2  Outpatient Specialists:None  Chief Complaint  Patient presents with  . Abdominal Pain       Brief Narrative   50 year old female with hypertension and recently diagnosed biliary colic presenting with epigastric and right upper quadrant abdominal pain. Patient found to have acute choledocholithiasis.    Subjective:   Abdominal pain much better. Denies nausea or vomiting.   Assessment  & Plan :    Principal Problem:   Acute Choledocholithiasis  -Evaluated by GI and surgery. Patient underwent ERCP with several gallstones and underwent removal with biliary sphincterotomy. Underwent laproscopic cholecystectomy with IOC concerning for retained gallstones. Stable postop. Transaminitis slowly improving. -GI recommends obtaining ERCP tomorrow (hopefully the stones may pass with sphincterotomy.  Active Problems:   Essential hypertension Stable. Continue lisinopril.     Code Status : Full code  Family Communication  : Husband and mother bedside  Disposition Plan  : Home tomorrow if ERCP negative for CBD stones.  Barriers For Discharge : Postop  Consults  :  GI CCS  Procedures  :  CT abdomen  ERCP and laparoscopic cholecystectomy  DVT Prophylaxis  :  Lovenox -   Lab Results  Component Value Date   PLT 249 09/18/2016    Antibiotics  :  None  Anti-infectives    Start     Dose/Rate Route Frequency Ordered Stop   09/19/16 0700  ceFAZolin (ANCEF) IVPB  2g/100 mL premix     2 g 200 mL/hr over 30 Minutes Intravenous On call to O.R. 09/19/16 0649 09/19/16 0739   09/18/16 1530  ciprofloxacin (CIPRO) IVPB 400 mg     400 mg 200 mL/hr over 60 Minutes Intravenous  Once 09/18/16 1517 09/18/16 1619        Objective:   Vitals:   09/19/16 1000 09/19/16 1020 09/19/16 1152 09/19/16 1302  BP: Marland Kitchen(!)  149/81 (!) 152/76 (!) 148/75 (!) 160/74  Pulse: 70 74 70 75  Resp: 12 14 16 18   Temp: 98.1 F (36.7 C) 98.2 F (36.8 C) 98.5 F (36.9 C) 98.1 F (36.7 C)  TempSrc:   Axillary Axillary  SpO2: 100% 100% 100% 100%  Weight:      Height:        Wt Readings from Last 3 Encounters:  09/18/16 67.1 kg (148 lb)     Intake/Output Summary (Last 24 hours) at 09/19/16 1313 Last data filed at 09/19/16 1230  Gross per 24 hour  Intake             4685 ml  Output             2005 ml  Net             2680 ml     Physical Exam  Gen: not in distress HEENT: moist mucosa, supple neck Chest: clear b/l, no added sounds CVS: N S1&S2, no murmurs, GI: Laparoscopic site clean, nondistended, bowel sounds present Musculoskeletal: warm, no edema     Data Review:    CBC  Recent Labs Lab 09/17/16 1729 09/18/16 0523  WBC 13.4* 10.7*  HGB 13.2 12.4  HCT 39.3 36.9  PLT 265 249  MCV 92.5 92.7  MCH 31.1 31.2  MCHC 33.6 33.6  RDW 11.7 12.4  LYMPHSABS 4.7*  --   MONOABS 1.1*  --   EOSABS 0.1  --   BASOSABS 0.0  --     Chemistries   Recent Labs Lab 09/17/16 1729 09/18/16 0523 09/19/16 0453  NA 139 139 139  K 3.6 3.9 4.0  CL 106 112* 114*  CO2 25 22 18*  GLUCOSE 102* 90 103*  BUN 13 14 10   CREATININE 0.91 0.92 0.72  CALCIUM 8.9 8.4* 8.2*  AST 106* 117* 46*  ALT 119* 149* 100*  ALKPHOS 129* 130* 115  BILITOT 1.5* 1.4* 0.9   ------------------------------------------------------------------------------------------------------------------ No results for input(s): CHOL, HDL, LDLCALC, TRIG, CHOLHDL, LDLDIRECT in the last 72 hours.  No  results found for: HGBA1C ------------------------------------------------------------------------------------------------------------------ No results for input(s): TSH, T4TOTAL, T3FREE, THYROIDAB in the last 72 hours.  Invalid input(s): FREET3 ------------------------------------------------------------------------------------------------------------------ No results for input(s): VITAMINB12, FOLATE, FERRITIN, TIBC, IRON, RETICCTPCT in the last 72 hours.  Coagulation profile No results for input(s): INR, PROTIME in the last 168 hours.  No results for input(s): DDIMER in the last 72 hours.  Cardiac Enzymes No results for input(s): CKMB, TROPONINI, MYOGLOBIN in the last 168 hours.  Invalid input(s): CK ------------------------------------------------------------------------------------------------------------------ No results found for: BNP  Inpatient Medications  Scheduled Meds: . enoxaparin (LOVENOX) injection  40 mg Subcutaneous QHS  . lisinopril  5 mg Oral Q breakfast  . Norethin Ace-Eth Estrad-FE  1 tablet Oral Daily   Continuous Infusions: . sodium chloride 75 mL/hr at 09/19/16 1119   PRN Meds:.acetaminophen **OR** acetaminophen, HYDROmorphone (DILAUDID) injection, ondansetron **OR** ondansetron (ZOFRAN) IV, oxyCODONE  Micro Results Recent Results (from the past 240 hour(s))  Surgical PCR screen     Status: None   Collection Time: 09/19/16  6:46 AM  Result Value Ref Range Status   MRSA, PCR NEGATIVE NEGATIVE Final   Staphylococcus aureus NEGATIVE NEGATIVE Final    Comment:        The Xpert SA Assay (FDA approved for NASAL specimens in patients over 14 years of age), is one component of a comprehensive surveillance program.  Test performance has been validated by [facility] for patients greater  than or equal to 51 year old. It is not intended to diagnose infection nor to guide or monitor treatment.     Radiology Reports Dg Cholangiogram Operative  Result  Date: 09/19/2016 CLINICAL DATA:  [id number] year old female undergoing intraoperative cholangiogram during cholecystectomy for cholelithiasis. EXAM: INTRAOPERATIVE CHOLANGIOGRAM TECHNIQUE: Cholangiographic images from the C-arm fluoroscopic device were submitted for interpretation post-operatively. Please see the procedural report for the amount of contrast and the fluoroscopy time utilized. COMPARISON:  ERCP performed yesterday. FINDINGS: Cine clip performed at the time of laparoscopic cholecystectomy and intraoperative cholangiogram demonstrates cannulation of the cystic duct remanent and opacification of the biliary tree. There is a single mobile filling defect in the distal common bile duct consistent with choledocholithiasis. The ampulla remains patent. Contrast material passes freely through the ampulla and into the duodenum. IMPRESSION: 1. Single mobile filling defect in the distal common bile duct consistent with choledocholithiasis. 2. The ampulla remains patent. Electronically Signed   By: Malachy MoanHeath  McCullough M.D.   On: 09/19/2016 09:22   Dg Ercp Biliary & Pancreatic Ducts  Result Date: 09/19/2016 CLINICAL DATA:  [id number] year old female with cholelithiasis and choledocholithiasis EXAM: ERCP TECHNIQUE: Multiple spot images obtained with the fluoroscopic device and submitted for interpretation post-procedure. FLUOROSCOPY TIME:  Fluoroscopy Time:  8 minutes 47 seconds reported COMPARISON:  Right upper quadrant ultrasound 09/17/2016 FINDINGS: Total of 5 intraoperative spot images and cine clips demonstrate a flexible endoscope in the descending duodenum followed by cannulation of the common bile duct. Contrast opacification demonstrates a dilated common bile duct with numerous faceted filling defects consistent with extensive choledocholithiasis. The cystic duct is patent. Contrast material opacifies the gallbladder which is filled with small faceted stones. Subsequent images demonstrate sphincterotomy and balloon  sweep of the common bile duct. IMPRESSION: 1. Cholelithiasis and choledocholithiasis. 2. Sphincterotomy and balloon sweeping of the common duct. These images were submitted for radiologic interpretation only. Please see the procedural report for the amount of contrast and the fluoroscopy time utilized. Electronically Signed   By: Malachy MoanHeath  McCullough M.D.   On: 09/19/2016 08:04   [place] Abdomen Limited Ruq  Result Date: 09/17/2016 CLINICAL DATA:  Epigastric pain for 1 day EXAM: US ABDOMEN LIMITED - RIGHT UPPER QUADRANT COMPARISON:  09/03/2016 FINDINGS: Gallbladder: Cholelithiasis is noted and well distended gallbladder. No significant gallbladder wall thickening is seen. No pericholecystic fluid is noted. Mild increased tenderness is noted over the gallbladder. Common bile duct: Diameter: 10 mm. There is increased echogenicity noted in the distal common bile duct with significant enlargement when compared with the prior exam consistent with distal common bile duct stones. Previous common bile duct measurement was 3 mm. Liver: No focal lesion identified. Mild intrahepatic ductal dilatation is seen. IMPRESSION: Cholelithiasis as well as evidence of distal common bile duct stones with significant biliary dilatation when compared with the prior exam. Electronically Signed   By: Alcide CleverMark  Lukens M.D.   On: 09/17/2016 18:32    Time Spent in minutes  25   Eddie NorthHUNGEL, Jomaira Darr M.D on 09/19/2016 at 1:13 PM  Between 7am to 7pm - Pager - [phone]  After 7pm go to [email or web address] - password [email or web address]  [facility] -  Office  [phone]

## 2016-09-19 NOTE — Transfer of Care (Signed)
Immediate Anesthesia Transfer of Care Note  Patient: Breanna AlstromSusan Linnen  Procedure(s) Performed: Procedure(s): LAPAROSCOPIC CHOLECYSTECTOMY WITH INTRAOPERATIVE CHOLANGIOGRAM (N/A)  Patient Location: PACU  Anesthesia Type:General  Level of Consciousness: awake, alert  and oriented  Airway & Oxygen Therapy: Patient Spontanous Breathing and Patient connected to nasal cannula oxygen  Post-op Assessment: Report given to RN and Post -op Vital signs reviewed and stable  Post vital signs: Reviewed and stable  Last Vitals:  Vitals:   09/19/16 0201 09/19/16 0550  BP: (!) 103/50 116/70  Pulse: 72 61  Resp: 16 16  Temp: 37.2 C 37.1 C    Last Pain:  Vitals:   09/19/16 0550  TempSrc: Oral  PainSc:       Patients Stated Pain Goal: 2 (09/18/16 2258)  Complications: No apparent anesthesia complications

## 2016-09-19 NOTE — Anesthesia Procedure Notes (Signed)
Procedure Name: Intubation Date/Time: 09/19/2016 7:35 AM Performed by: Breanna LibmanEARDON, Breanna Martorelli L Patient Re-evaluated:Patient Re-evaluated prior to inductionOxygen Delivery Method: Circle system utilized Preoxygenation: Pre-oxygenation with 100% oxygen Intubation Type: IV induction, Cricoid Pressure applied and Rapid sequence Laryngoscope Size: Miller and 2 Grade View: Grade I Tube size: 7.0 mm Number of attempts: 1 Airway Equipment and Method: Stylet Placement Confirmation: ETT inserted through vocal cords under direct vision,  positive ETCO2 and breath sounds checked- equal and bilateral Secured at: 21 cm Tube secured with: Tape Dental Injury: Teeth and Oropharynx as per pre-operative assessment

## 2016-09-19 NOTE — H&P (View-Only) (Signed)
Reason for Consult:elevated LFTs, dilated common bile duct, gallstones Referring Physician: dr danford  Breanna Harrison is an 50 y.o. female.  HPI: 50 year old Caucasian female with known gallstones and hypertension presented to [facility] yesterday for recurrent worsening epigastric pain associated with nausea. Repeat ultrasound there showed now dilated common bile duct with probable stones and elevated LFTs. She was transferred to [facility] long for definitive care.  She reports a few weeks ago in the middle the night she awoke with epigastric pain that lasted for about 4-5 hours. It radiated to her back. It was associated with nausea. She discussed with her primary care physician who ordered an ultrasound which showed gallstones as well as a mildly elevated lipase. She was subsequently referred to general surgery at [facility]. They discussed gallbladder surgery but elected to wait and continue observation. Patient states that she developed recurrent epigastric pain on Thanksgiving day it was initially mild but worsened throughout the day and evening and finally prompted her to go to the emergency room the following day. She denies any fevers, chills, jaundice or acholic stools. She takes NSAIDs occasionally. She denies any weight loss.  Past Medical History:  Diagnosis Date  . Gallstones   . Hypertension     History reviewed. No pertinent surgical history.  Family History  Problem Relation Age of Onset  . Breast cancer Mother   . Gallbladder disease Mother   . Parkinson's disease Father   . Heart attack Father 18  . Stroke Brother 68    Social History:  reports that she has never smoked. She has never used smokeless tobacco. She reports that she drinks alcohol. She reports that she does not use drugs.  Allergies:  Allergies  Allergen Reactions  . Sulfamethoxazole Itching and Rash    Medications: I have reviewed the patient's current medications.  Results for orders placed  or performed during the hospital encounter of 09/17/16 (from the past 48 hour(s))  Urinalysis, Routine w reflex microscopic (not at [facility])     Status: Abnormal   Collection Time: 09/17/16  3:56 PM  Result Value Ref Range   Color, Urine YELLOW YELLOW   APPearance CLEAR CLEAR   Specific Gravity, Urine 1.007 1.005 - 1.030   pH 8.0 5.0 - 8.0   Glucose, UA NEGATIVE NEGATIVE mg/dL   Hgb urine dipstick NEGATIVE NEGATIVE   Bilirubin Urine NEGATIVE NEGATIVE   Ketones, ur NEGATIVE NEGATIVE mg/dL   Protein, ur NEGATIVE NEGATIVE mg/dL   Nitrite NEGATIVE NEGATIVE   Leukocytes, UA SMALL (A) NEGATIVE  Pregnancy, urine     Status: None   Collection Time: 09/17/16  3:56 PM  Result Value Ref Range   Preg Test, Ur NEGATIVE NEGATIVE    Comment:        THE SENSITIVITY OF THIS METHODOLOGY IS >20 mIU/mL.   Urine microscopic-add on     Status: Abnormal   Collection Time: 09/17/16  3:56 PM  Result Value Ref Range   Squamous Epithelial / LPF 0-5 (A) NONE SEEN   WBC, UA 0-5 0 - 5 WBC/hpf   RBC / HPF NONE SEEN 0 - 5 RBC/hpf   Bacteria, UA MANY (A) NONE SEEN  CBC with Differential     Status: Abnormal   Collection Time: 09/17/16  5:29 PM  Result Value Ref Range   WBC 13.4 (H) 4.0 - 10.5 K/uL   RBC 4.25 3.87 - 5.11 MIL/uL   Hemoglobin 13.2 12.0 - 15.0 g/dL   HCT 39.3 36.0 - 46.0 %  MCV 92.5 78.0 - 100.0 fL   MCH 31.1 26.0 - 34.0 pg   MCHC 33.6 30.0 - 36.0 g/dL   RDW 11.7 11.5 - 15.5 %   Platelets 265 150 - 400 K/uL   Neutrophils Relative % 56 %   Neutro Abs 7.5 1.7 - 7.7 K/uL   Lymphocytes Relative 35 %   Lymphs Abs 4.7 (H) 0.7 - 4.0 K/uL   Monocytes Relative 8 %   Monocytes Absolute 1.1 (H) 0.1 - 1.0 K/uL   Eosinophils Relative 1 %   Eosinophils Absolute 0.1 0.0 - 0.7 K/uL   Basophils Relative 0 %   Basophils Absolute 0.0 0.0 - 0.1 K/uL  Comprehensive metabolic panel     Status: Abnormal   Collection Time: 09/17/16  5:29 PM  Result Value Ref Range   Sodium 139 135 - 145 mmol/L   Potassium  3.6 3.5 - 5.1 mmol/L   Chloride 106 101 - 111 mmol/L   CO2 25 22 - 32 mmol/L   Glucose, Bld 102 (H) 65 - 99 mg/dL   BUN 13 6 - 20 mg/dL   Creatinine, Ser 0.91 0.44 - 1.00 mg/dL   Calcium 8.9 8.9 - 10.3 mg/dL   Total Protein 7.5 6.5 - 8.1 g/dL   Albumin 4.0 3.5 - 5.0 g/dL   AST 106 (H) 15 - 41 U/L   ALT 119 (H) 14 - 54 U/L   Alkaline Phosphatase 129 (H) 38 - 126 U/L   Total Bilirubin 1.5 (H) 0.3 - 1.2 mg/dL   GFR calc non Af Amer >60 >60 mL/min   GFR calc Af Amer >60 >60 mL/min    Comment: (NOTE) The eGFR has been calculated using the CKD EPI equation. This calculation has not been validated in all clinical situations. eGFR's persistently <60 mL/min signify possible Chronic Kidney Disease.    Anion gap 8 5 - 15  Lipase, blood     Status: None   Collection Time: 09/17/16  5:29 PM  Result Value Ref Range   Lipase 23 11 - 51 U/L  CBC     Status: Abnormal   Collection Time: 09/18/16  5:23 AM  Result Value Ref Range   WBC 10.7 (H) 4.0 - 10.5 K/uL   RBC 3.98 3.87 - 5.11 MIL/uL   Hemoglobin 12.4 12.0 - 15.0 g/dL   HCT 36.9 36.0 - 46.0 %   MCV 92.7 78.0 - 100.0 fL   MCH 31.2 26.0 - 34.0 pg   MCHC 33.6 30.0 - 36.0 g/dL   RDW 12.4 11.5 - 15.5 %   Platelets 249 150 - 400 K/uL  Comprehensive metabolic panel     Status: Abnormal   Collection Time: 09/18/16  5:23 AM  Result Value Ref Range   Sodium 139 135 - 145 mmol/L   Potassium 3.9 3.5 - 5.1 mmol/L   Chloride 112 (H) 101 - 111 mmol/L   CO2 22 22 - 32 mmol/L   Glucose, Bld 90 65 - 99 mg/dL   BUN 14 6 - 20 mg/dL   Creatinine, Ser 0.92 0.44 - 1.00 mg/dL   Calcium 8.4 (L) 8.9 - 10.3 mg/dL   Total Protein 6.6 6.5 - 8.1 g/dL   Albumin 3.5 3.5 - 5.0 g/dL   AST 117 (H) 15 - 41 U/L   ALT 149 (H) 14 - 54 U/L   Alkaline Phosphatase 130 (H) 38 - 126 U/L   Total Bilirubin 1.4 (H) 0.3 - 1.2 mg/dL   GFR calc non Af  Amer >60 >60 mL/min   GFR calc Af Amer >60 >60 mL/min    Comment: (NOTE) The eGFR has been calculated using the CKD EPI  equation. This calculation has not been validated in all clinical situations. eGFR's persistently <60 mL/min signify possible Chronic Kidney Disease.    Anion gap 5 5 - 15    US Abdomen Limited Ruq  Result Date: 09/17/2016 CLINICAL DATA:  Epigastric pain for 1 day EXAM: US ABDOMEN LIMITED - RIGHT UPPER QUADRANT COMPARISON:  09/03/2016 FINDINGS: Gallbladder: Cholelithiasis is noted and well distended gallbladder. No significant gallbladder wall thickening is seen. No pericholecystic fluid is noted. Mild increased tenderness is noted over the gallbladder. Common bile duct: Diameter: 10 mm. There is increased echogenicity noted in the distal common bile duct with significant enlargement when compared with the prior exam consistent with distal common bile duct stones. Previous common bile duct measurement was 3 mm. Liver: No focal lesion identified. Mild intrahepatic ductal dilatation is seen. IMPRESSION: Cholelithiasis as well as evidence of distal common bile duct stones with significant biliary dilatation when compared with the prior exam. Electronically Signed   By: Inez Catalina M.D.   On: 09/17/2016 18:32    Review of Systems  Constitutional: Negative for weight loss.  HENT: Negative for nosebleeds.   Eyes: Negative for blurred vision.  Respiratory: Negative for shortness of breath.   Cardiovascular: Negative for chest pain, palpitations, orthopnea and PND.       Denies DOE  Gastrointestinal: Positive for abdominal pain and nausea. Negative for blood in stool and melena.  Genitourinary: Negative for dysuria and hematuria.  Musculoskeletal: Negative.   Skin: Negative for itching and rash.  Neurological: Negative for dizziness, focal weakness, seizures, loss of consciousness and headaches.       Denies TIAs, amaurosis fugax  Endo/Heme/Allergies: Does not bruise/bleed easily.  Psychiatric/Behavioral: The patient is not nervous/anxious.    Blood pressure (!) 147/77, pulse (!) 56,  temperature 97.9 F (36.6 C), resp. rate 17, height '5\' 3"'$  (1.6 m), weight 67.1 kg (148 lb), last menstrual period 08/25/2016, SpO2 98 %. Physical Exam  Vitals reviewed. Constitutional: She is oriented to person, place, and time. She appears well-developed and well-nourished. No distress.  HENT:  Head: Normocephalic and atraumatic.  Right Ear: External ear normal.  Left Ear: External ear normal.  Eyes: Conjunctivae are normal. No scleral icterus.  Neck: Normal range of motion. Neck supple. No tracheal deviation present. No thyromegaly present.  Cardiovascular: Normal rate and normal heart sounds.   Respiratory: Effort normal and breath sounds normal. No stridor. No respiratory distress. She has no wheezes.  GI: Soft. She exhibits no distension. There is tenderness in the epigastric area. There is no rigidity, no rebound and no guarding.    Mild TTP   Musculoskeletal: She exhibits no edema or tenderness.  Lymphadenopathy:    She has no cervical adenopathy.  Neurological: She is alert and oriented to person, place, and time. She exhibits normal muscle tone.  Skin: Skin is warm and dry. No rash noted. She is not diaphoretic. No erythema. No pallor.  Psychiatric: She has a normal mood and affect. Her behavior is normal. Judgment and thought content normal.    Assessment/Plan: Symptomatic cholelithiasis Elevated LFTs Choledocholithiasis Hypertension  Her ultrasound is strongly suspicious for a distal common bile duct stone. I recommend ductal evaluation first. I will defer to GI whether or not they want an MRCP first or go straight to ERCP. Her duct is now dilated compared  to an ultrasound done just 3 weeks ago and her LFTs remain elevated today. Once she has had her duct evaluated and managed I recommended same hospitalization laparoscopic cholecystectomy  I drew diagrams of biliary anatomy and discussed the rationale for this algorithm. We will discuss risk and benefits of surgery at a  later date.  Agree with antibiotics if patient spikes a temperature  We'll follow along  Leighton Ruff. Redmond Pulling, MD, FACS General, Bariatric, & Minimally Invasive Surgery [facility], [place]   [facility] M 09/18/2016, 9:12 AM

## 2016-09-20 ENCOUNTER — Inpatient Hospital Stay (HOSPITAL_COMMUNITY): Payer: BLUE CROSS/BLUE SHIELD | Admitting: Anesthesiology

## 2016-09-20 ENCOUNTER — Encounter (HOSPITAL_COMMUNITY): Payer: Self-pay

## 2016-09-20 ENCOUNTER — Inpatient Hospital Stay (HOSPITAL_COMMUNITY): Payer: BLUE CROSS/BLUE SHIELD

## 2016-09-20 ENCOUNTER — Encounter (HOSPITAL_COMMUNITY)
Admission: EM | Disposition: A | Discharge status: Home / Self Care | Payer: Self-pay | Source: Home / Self Care | Attending: Internal Medicine

## 2016-09-20 HISTORY — PX: ERCP: SHX5425

## 2016-09-20 LAB — CBC
HCT: 33.1 % — ABNORMAL LOW (ref 36.0–46.0)
Hemoglobin: 11 g/dL — ABNORMAL LOW (ref 12.0–15.0)
MCH: 31.5 pg (ref 26.0–34.0)
MCHC: 33.2 g/dL (ref 30.0–36.0)
MCV: 94.8 fL (ref 78.0–100.0)
PLATELETS: 202 10*3/uL (ref 150–400)
RBC: 3.49 MIL/uL — AB (ref 3.87–5.11)
RDW: 12.8 % (ref 11.5–15.5)
WBC: 16.4 10*3/uL — ABNORMAL HIGH (ref 4.0–10.5)

## 2016-09-20 LAB — HEPATIC FUNCTION PANEL
ALT: 77 U/L — ABNORMAL HIGH (ref 14–54)
AST: 35 U/L (ref 15–41)
Albumin: 3 g/dL — ABNORMAL LOW (ref 3.5–5.0)
Alkaline Phosphatase: 87 U/L (ref 38–126)
BILIRUBIN DIRECT: 0.2 mg/dL (ref 0.1–0.5)
BILIRUBIN TOTAL: 0.9 mg/dL (ref 0.3–1.2)
Indirect Bilirubin: 0.7 mg/dL (ref 0.3–0.9)
Total Protein: 5.5 g/dL — ABNORMAL LOW (ref 6.5–8.1)

## 2016-09-20 SURGERY — ERCP, WITH INTERVENTION IF INDICATED
Anesthesia: General

## 2016-09-20 MED ORDER — SCOPOLAMINE 1 MG/3DAYS TD PT72
MEDICATED_PATCH | TRANSDERMAL | Status: DC | PRN
  Administered 2016-09-20: 1 via TRANSDERMAL

## 2016-09-20 MED ORDER — FENTANYL CITRATE (PF) 100 MCG/2ML IJ SOLN
INTRAMUSCULAR | Status: AC
  Filled 2016-09-20: qty 2

## 2016-09-20 MED ORDER — FENTANYL CITRATE (PF) 100 MCG/2ML IJ SOLN
INTRAMUSCULAR | Status: DC | PRN
  Administered 2016-09-20: 50 ug via INTRAVENOUS

## 2016-09-20 MED ORDER — PROPOFOL 10 MG/ML IV BOLUS
INTRAVENOUS | Status: DC | PRN
  Administered 2016-09-20: 150 mg via INTRAVENOUS

## 2016-09-20 MED ORDER — MIDAZOLAM HCL 2 MG/2ML IJ SOLN
INTRAMUSCULAR | Status: AC
  Filled 2016-09-20: qty 2

## 2016-09-20 MED ORDER — DEXAMETHASONE SODIUM PHOSPHATE 10 MG/ML IJ SOLN
INTRAMUSCULAR | Status: DC | PRN
  Administered 2016-09-20: 10 mg via INTRAVENOUS

## 2016-09-20 MED ORDER — LACTATED RINGERS IV SOLN
INTRAVENOUS | Status: DC | PRN
  Administered 2016-09-20: 10:00:00 via INTRAVENOUS

## 2016-09-20 MED ORDER — ONDANSETRON HCL 4 MG/2ML IJ SOLN
INTRAMUSCULAR | Status: DC | PRN
  Administered 2016-09-20: 4 mg via INTRAVENOUS

## 2016-09-20 MED ORDER — INDOMETHACIN 50 MG RE SUPP
RECTAL | Status: AC
  Filled 2016-09-20: qty 2

## 2016-09-20 MED ORDER — SUCCINYLCHOLINE CHLORIDE 200 MG/10ML IV SOSY
PREFILLED_SYRINGE | INTRAVENOUS | Status: AC
  Filled 2016-09-20: qty 10

## 2016-09-20 MED ORDER — IOPAMIDOL (ISOVUE-300) INJECTION 61%
INTRAVENOUS | Status: DC | PRN
  Administered 2016-09-20: 30 mL

## 2016-09-20 MED ORDER — SUCCINYLCHOLINE CHLORIDE 200 MG/10ML IV SOSY
PREFILLED_SYRINGE | INTRAVENOUS | Status: DC | PRN
  Administered 2016-09-20: 100 mg via INTRAVENOUS

## 2016-09-20 MED ORDER — PROPOFOL 10 MG/ML IV BOLUS
INTRAVENOUS | Status: AC
  Filled 2016-09-20: qty 20

## 2016-09-20 MED ORDER — LIDOCAINE 2% (20 MG/ML) 5 ML SYRINGE
INTRAMUSCULAR | Status: DC | PRN
  Administered 2016-09-20: 100 mg via INTRAVENOUS

## 2016-09-20 MED ORDER — SCOPOLAMINE 1 MG/3DAYS TD PT72
MEDICATED_PATCH | TRANSDERMAL | Status: AC
  Filled 2016-09-20: qty 1

## 2016-09-20 MED ORDER — LIDOCAINE 2% (20 MG/ML) 5 ML SYRINGE
INTRAMUSCULAR | Status: AC
  Filled 2016-09-20: qty 5

## 2016-09-20 MED ORDER — GLUCAGON HCL RDNA (DIAGNOSTIC) 1 MG IJ SOLR
INTRAMUSCULAR | Status: AC
  Filled 2016-09-20: qty 1

## 2016-09-20 MED ORDER — MIDAZOLAM HCL 5 MG/5ML IJ SOLN
INTRAMUSCULAR | Status: DC | PRN
Start: 2016-09-20 — End: 2016-09-20
  Administered 2016-09-20: 2 mg via INTRAVENOUS

## 2016-09-20 NOTE — Discharge Instructions (Signed)
[facility] ______[facility] °LAPAROSCOPIC SURGERY: POST OP INSTRUCTIONS °Always review your discharge instruction sheet given to you by the facility where your surgery was performed. °IF YOU HAVE DISABILITY OR FAMILY LEAVE FORMS, YOU MUST BRING THEM TO THE OFFICE FOR PROCESSING.   °DO NOT GIVE THEM TO YOUR DOCTOR. ° °1. A prescription for pain medication may be given to you upon discharge.  Take your pain medication as prescribed, if needed.  If narcotic pain medicine is not needed, then you may take acetaminophen (Tylenol) or ibuprofen (Advil) as needed. °2. Take your usually prescribed medications unless otherwise directed. °3. If you need a refill on your pain medication, please contact your pharmacy.  They will contact our office to request authorization. Prescriptions will not be filled after 5pm or on week-ends. °4. You should follow a light diet the first few days after arrival home, such as soup and crackers, etc.  Be sure to include lots of fluids daily. °5. Most patients will experience some swelling and bruising in the area of the incisions.  Ice packs will help.  Swelling and bruising can take several days to resolve.  °6. It is common to experience some constipation if taking pain medication after surgery.  Increasing fluid intake and taking a stool softener (such as Colace) will usually help or prevent this problem from occurring.  A mild laxative (Milk of Magnesia or Miralax) should be taken according to package instructions if there are no bowel movements after 48 hours. °7. Unless discharge instructions indicate otherwise, you may remove your bandages 24-48 hours after surgery, and you may shower at that time.  You may have steri-strips (small skin tapes) in place directly over the incision.  These strips should be left on the skin for 7-10 days.  If your surgeon used skin glue on the incision, you may shower in 24 hours.  The glue will flake off over the next 2-3 weeks.  Any sutures or  staples will be removed at the office during your follow-up visit. °8. ACTIVITIES:  You may resume regular (light) daily activities beginning the next day--such as daily self-care, walking, climbing stairs--gradually increasing activities as tolerated.  You may have sexual intercourse when it is comfortable.  Refrain from any heavy lifting or straining until approved by your doctor. °a. You may drive when you are no longer taking prescription pain medication, you can comfortably wear a seatbelt, and you can safely maneuver your car and apply brakes. °b. RETURN TO WORK:  __________________________________________________________ °9. You should see your doctor in the office for a follow-up appointment approximately 2-3 weeks after your surgery.  Make sure that you call for this appointment within a day or two after you arrive home to insure a convenient appointment time. °10. OTHER INSTRUCTIONS: __________________________________________________________________________________________________________________________ __________________________________________________________________________________________________________________________ °WHEN TO CALL YOUR DOCTOR: °1. Fever over 101.0 °2. Inability to urinate °3. Continued bleeding from incision. °4. Increased pain, redness, or drainage from the incision. °5. Increasing abdominal pain ° °The clinic staff is available to answer your questions during regular business hours.  Please don’t hesitate to call and ask to speak to one of the nurses for clinical concerns.  If you have a medical emergency, go to the nearest emergency room or call 911.  A surgeon from [facility] is always on call at the hospital. °[address], [address], [place], [place]  [address] ? [address] [address], [place], [place]   [address] °[phone] ? [phone] ? FAX [phone] °Web site:   [email or web address] °

## 2016-09-20 NOTE — Progress Notes (Signed)
Patient ID: Breanna AlstromSusan Harrison, female   DOB: 12/27/1965, 50 y.o.   MRN: [id number]   LOS: 3 days   Subjective: Doing well s/p ERCP this am. Denies N/V, pain controlled with orals. Tolerating clears.   Objective: Vital signs in last 24 hours: Temp:  [97.5 F (36.4 C)-99.2 F (37.3 C)] 98 F (36.7 C) (11/27 1145) Pulse Rate:  [50-65] 50 (11/27 1145) Resp:  [10-17] 16 (11/27 1145) BP: (111-170)/(61-78) 153/78 (11/27 1145) SpO2:  [95 %-100 %] 100 % (11/27 1145) Weight:  [67.1 kg (148 lb)] 67.1 kg (148 lb) (11/27 0940) Last BM Date: 09/17/16   Laboratory  CBC  Recent Labs  09/18/16 0523 09/20/16 0418  WBC 10.7* 16.4*  HGB 12.4 11.0*  HCT 36.9 33.1*  PLT 249 202    Physical Exam General appearance: alert and no distress Resp: clear to auscultation bilaterally Cardio: regular rate and rhythm GI: normal findings: bowel sounds normal and soft, non-tender   Assessment/Plan: Symptomatic cholelithiasis s/p lap chole 11/26 -- Ok for d/c from surgical standpoint Choledocholithiasis s/p ERCP 11/25 and 11/27 -- per GI    Freeman CaldronMichael J. Merrell Borsuk, PA-C Pager: [phone] 09/20/2016

## 2016-09-20 NOTE — Anesthesia Postprocedure Evaluation (Addendum)
Anesthesia Post Note  Patient: Breanna AlstromSusan Matt  Procedure(s) Performed: Procedure(s) (LRB): ENDOSCOPIC RETROGRADE CHOLANGIOPANCREATOGRAPHY (ERCP) (N/A)  Patient location during evaluation: Endoscopy Anesthesia Type: General Level of consciousness: sedated Pain management: pain level controlled Vital Signs Assessment: post-procedure vital signs reviewed and stable Respiratory status: spontaneous breathing and respiratory function stable Cardiovascular status: stable Anesthetic complications: no    Last Vitals:  Vitals:   09/20/16 1100 09/20/16 1110  BP: (!) 141/69 (!) 144/71  Pulse: (!) 54 (!) 54  Resp: 17 16  Temp:      Last Pain:  Vitals:   09/20/16 1053  TempSrc: Oral  PainSc:                  Daria Mcmeekin DANIEL

## 2016-09-20 NOTE — Progress Notes (Signed)
[facility] Progress Note  Subjective: Mild incisional tenderness. Otherwise ok.   Objective:  Vital signs in last 24 hours: Temp:  [98.1 F (36.7 C)-99.2 F (37.3 C)] 98.6 F (37 C) (11/27 0508) Pulse Rate:  [53-75] 53 (11/27 0508) Resp:  [9-18] 16 (11/27 0508) BP: (111-160)/(61-84) 126/61 (11/27 0508) SpO2:  [96 %-100 %] 96 % (11/27 0508) Last BM Date: 09/17/16 General:   Alert, well-developed,    in NAD EENT:  Normal hearing, non icteric sclera, conjunctive pink.  Heart:  Regular rate and rhythm; no murmurs. no lower extremity edema Pulm: Normal respiratory effort, lungs CTA bilaterally without wheezes or crackles. Abdomen:  Soft, nondistended, nontender.  Normal bowel sounds, no masses felt. No hepatomegaly.    Neurologic:  Alert and  oriented x4;  grossly normal neurologically. Psych:  Alert and cooperative. Normal mood and affect. Skin:   Intake/Output from previous day: 11/26 0701 - 11/27 0700 In: 6283.7 [P.O.:1380; I.V.:4903.7] Out: 2340 [Urine:2290; Blood:50] Intake/Output this shift: No intake/output data recorded.  Lab Results:  Recent Labs  09/17/16 1729 09/18/16 0523 09/20/16 0418  WBC 13.4* 10.7* 16.4*  HGB 13.2 12.4 11.0*  HCT 39.3 36.9 33.1*  PLT 265 249 202   BMET  Recent Labs  09/17/16 1729 09/18/16 0523 09/19/16 0453  NA 139 139 139  K 3.6 3.9 4.0  CL 106 112* 114*  CO2 25 22 18*  GLUCOSE 102* 90 103*  BUN 13 14 10   CREATININE 0.91 0.92 0.72  CALCIUM 8.9 8.4* 8.2*   LFT  Recent Labs  09/20/16 0418  PROT 5.5*  ALBUMIN 3.0*  AST 35  ALT 77*  ALKPHOS 87  BILITOT 0.9  BILIDIR 0.2  IBILI 0.7   PT/INR No results for input(s): LABPROT, INR in the last 72 hours. Hepatitis Panel No results for input(s): HEPBSAG, HCVAB, HEPAIGM, HEPBIGM in the last 72 hours.  Dg Cholangiogram Operative  Result Date: 09/19/2016 CLINICAL DATA:  50 year old female undergoing intraoperative cholangiogram during cholecystectomy  for cholelithiasis. EXAM: INTRAOPERATIVE CHOLANGIOGRAM TECHNIQUE: Cholangiographic images from the C-arm fluoroscopic device were submitted for interpretation post-operatively. Please see the procedural report for the amount of contrast and the fluoroscopy time utilized. COMPARISON:  ERCP performed yesterday. FINDINGS: Cine clip performed at the time of laparoscopic cholecystectomy and intraoperative cholangiogram demonstrates cannulation of the cystic duct remanent and opacification of the biliary tree. There is a single mobile filling defect in the distal common bile duct consistent with choledocholithiasis. The ampulla remains patent. Contrast material passes freely through the ampulla and into the duodenum. IMPRESSION: 1. Single mobile filling defect in the distal common bile duct consistent with choledocholithiasis. 2. The ampulla remains patent. Electronically Signed   By: Malachy MoanHeath  McCullough M.D.   On: 09/19/2016 09:22   Dg Ercp Biliary & Pancreatic Ducts  Result Date: 09/19/2016 CLINICAL DATA:  50 year old female with cholelithiasis and choledocholithiasis EXAM: ERCP TECHNIQUE: Multiple spot images obtained with the fluoroscopic device and submitted for interpretation post-procedure. FLUOROSCOPY TIME:  Fluoroscopy Time:  8 minutes 47 seconds reported COMPARISON:  Right upper quadrant ultrasound 09/17/2016 FINDINGS: Total of 5 intraoperative spot images and cine clips demonstrate a flexible endoscope in the descending duodenum followed by cannulation of the common bile duct. Contrast opacification demonstrates a dilated common bile duct with numerous faceted filling defects consistent with extensive choledocholithiasis. The cystic duct is patent. Contrast material opacifies the gallbladder which is filled with small faceted stones. Subsequent images demonstrate sphincterotomy and balloon sweep of the common bile duct.  IMPRESSION: 1. Cholelithiasis and choledocholithiasis. 2. Sphincterotomy and balloon  sweeping of the common duct. These images were submitted for radiologic interpretation only. Please see the procedural report for the amount of contrast and the fluoroscopy time utilized. Electronically Signed   By: Malachy MoanHeath  McCullough M.D.   On: 09/19/2016 08:04    Assessment / Plan  Retained CBD stone post ERCP/shinct (Dr Madilyn FiremanHayes) and lap chole (Dr. Andrey CampanileWilson). Given stone size, plan ERCP with stone extraction.The nature of the procedure, as well as the risks, benefits, and alternatives were carefully and thoroughly reviewed with the patient. Ample time for discussion and questions allowed. The patient understood, was satisfied, and agreed to proceed.  Principal Problem:   Choledocholithiasis s/p ERCP 09/18/2016 Active Problems:   Essential hypertension   Chronic cholecystitis with calculus   Transaminitis     LOS: 3 days   Breanna BonitoJohn N. Eda Harrison, [name], M.D. [facility]

## 2016-09-20 NOTE — Addendum Note (Signed)
Addendum  created 09/20/16 1345 by Heather RobertsJames Eddi Hymes, MD   Sign clinical note

## 2016-09-20 NOTE — Progress Notes (Signed)
PROGRESS NOTE                                                                                                                                                                                                             Patient Demographics:    Breanna Harrison, is a 50 y.o. female, DOB - 04-28-1966, WUJ:[id number]  Admit date - 09/17/2016   Admitting Physician Alberteen Sam, MD  Outpatient Primary MD for the patient is HUNTER, [name], FNP  LOS - 3  Outpatient Specialists:None  Chief Complaint  Patient presents with  . Abdominal Pain       Brief Narrative   50 year old female with hypertension and recently diagnosed biliary colic presenting with epigastric and right upper quadrant abdominal pain. Patient found to have acute choledocholithiasis.    Subjective:   Abdominal pain much better. Denies nausea or vomiting.no fever or chills. Wants to go home.    Assessment  & Plan :    Principal Problem:   Acute Choledocholithiasis  -Evaluated by GI and surgery. Patient underwent ERCP with several gallstones and underwent removal with biliary sphincterotomy. Underwent laproscopic cholecystectomy with IOC concerning for retained gallstones. Stable postop. Transaminitis slowly improving. -underwent ERCP shows choledocholithiasis , complete removal by baloon extraction and a biliary sphincterotomy was done. Low fat diet for 24 hours.    Active Problems:   Essential hypertension  sub optimal prn hydralazine.  Continue lisinopril.     Code Status : Full code  Family Communication  : Husband and mother bedside  Disposition Plan  : Home tomorrow in am.   Barriers For Discharge : Postop  Consults  :  GI CCS  Procedures  :  CT abdomen  ERCP and laparoscopic cholecystectomy  DVT Prophylaxis  :  Lovenox -   Lab Results  Component Value Date   PLT 202 09/20/2016    Antibiotics  :   None  Anti-infectives    Start     Dose/Rate Route Frequency Ordered Stop   09/19/16 0700  ceFAZolin (ANCEF) IVPB 2g/100 mL premix     2 g 200 mL/hr over 30 Minutes Intravenous On call to O.R. 09/19/16 0649 09/19/16 0739   09/18/16 1530  ciprofloxacin (CIPRO) IVPB 400 mg     400 mg 200 mL/hr over 60 Minutes Intravenous  Once 09/18/16 1517 09/18/16 1619  Objective:   Vitals:   09/20/16 1110 09/20/16 1145 09/20/16 1245 09/20/16 1355  BP: (!) 144/71 (!) 153/78 (!) 153/72 (!) 162/74  Pulse: (!) 54 (!) 50 (!) 54 (!) 54  Resp: 16 16 16 16   Temp:  98 F (36.7 C) 98.3 F (36.8 C) 98.3 F (36.8 C)  TempSrc:  Oral Oral Oral  SpO2: 100% 100% 100% 100%  Weight:      Height:        Wt Readings from Last 3 Encounters:  09/20/16 67.1 kg (148 lb)     Intake/Output Summary (Last 24 hours) at 09/20/16 1829 Last data filed at 09/20/16 1600  Gross per 24 hour  Intake          5877.45 ml  Output             2190 ml  Net          3687.45 ml     Physical Exam  Gen: not in distress HEENT: moist mucosa, supple neck Chest: clear b/l, no added sounds CVS: N S1&S2, no murmurs, GI: Laparoscopic site clean, nondistended, bowel sounds present Musculoskeletal: warm, no edema     Data Review:    CBC  Recent Labs Lab 09/17/16 1729 09/18/16 0523 09/20/16 0418  WBC 13.4* 10.7* 16.4*  HGB 13.2 12.4 11.0*  HCT 39.3 36.9 33.1*  PLT 265 249 202  MCV 92.5 92.7 94.8  MCH 31.1 31.2 31.5  MCHC 33.6 33.6 33.2  RDW 11.7 12.4 12.8  LYMPHSABS 4.7*  --   --   MONOABS 1.1*  --   --   EOSABS 0.1  --   --   BASOSABS 0.0  --   --     Chemistries   Recent Labs Lab 09/17/16 1729 09/18/16 0523 09/19/16 0453 09/20/16 0418  NA 139 139 139  --   K 3.6 3.9 4.0  --   CL 106 112* 114*  --   CO2 25 22 18*  --   GLUCOSE 102* 90 103*  --   BUN 13 14 10   --   CREATININE 0.91 0.92 0.72  --   CALCIUM 8.9 8.4* 8.2*  --   AST 106* 117* 46* 35  ALT 119* 149* 100* 77*  ALKPHOS 129*  130* 115 87  BILITOT 1.5* 1.4* 0.9 0.9   ------------------------------------------------------------------------------------------------------------------ No results for input(s): CHOL, HDL, LDLCALC, TRIG, CHOLHDL, LDLDIRECT in the last 72 hours.  No results found for: HGBA1C ------------------------------------------------------------------------------------------------------------------ No results for input(s): TSH, T4TOTAL, T3FREE, THYROIDAB in the last 72 hours.  Invalid input(s): FREET3 ------------------------------------------------------------------------------------------------------------------ No results for input(s): VITAMINB12, FOLATE, FERRITIN, TIBC, IRON, RETICCTPCT in the last 72 hours.  Coagulation profile No results for input(s): INR, PROTIME in the last 168 hours.  No results for input(s): DDIMER in the last 72 hours.  Cardiac Enzymes No results for input(s): CKMB, TROPONINI, MYOGLOBIN in the last 168 hours.  Invalid input(s): CK ------------------------------------------------------------------------------------------------------------------ No results found for: BNP  Inpatient Medications  Scheduled Meds: . enoxaparin (LOVENOX) injection  40 mg Subcutaneous QHS  . lisinopril  5 mg Oral Q breakfast  . Norethin Ace-Eth Estrad-FE  1 tablet Oral Daily   Continuous Infusions: . sodium chloride 75 mL/hr at 09/20/16 1741   PRN Meds:.acetaminophen **OR** acetaminophen, HYDROmorphone (DILAUDID) injection, ondansetron **OR** ondansetron (ZOFRAN) IV, oxyCODONE  Micro Results Recent Results (from the past 240 hour(s))  Surgical PCR screen     Status: None   Collection Time: 09/19/16  6:46 AM  Result Value  Ref Range Status   MRSA, PCR NEGATIVE NEGATIVE Final   Staphylococcus aureus NEGATIVE NEGATIVE Final    Comment:        The Xpert SA Assay (FDA approved for NASAL specimens in patients over 51 years of age), is one component of a comprehensive  surveillance program.  Test performance has been validated by [facility] for patients greater than or equal to 325 year old. It is not intended to diagnose infection nor to guide or monitor treatment.     Radiology Reports Dg Cholangiogram Operative  Result Date: 09/19/2016 CLINICAL DATA:  50 year old female undergoing intraoperative cholangiogram during cholecystectomy for cholelithiasis. EXAM: INTRAOPERATIVE CHOLANGIOGRAM TECHNIQUE: Cholangiographic images from the C-arm fluoroscopic device were submitted for interpretation post-operatively. Please see the procedural report for the amount of contrast and the fluoroscopy time utilized. COMPARISON:  ERCP performed yesterday. FINDINGS: Cine clip performed at the time of laparoscopic cholecystectomy and intraoperative cholangiogram demonstrates cannulation of the cystic duct remanent and opacification of the biliary tree. There is a single mobile filling defect in the distal common bile duct consistent with choledocholithiasis. The ampulla remains patent. Contrast material passes freely through the ampulla and into the duodenum. IMPRESSION: 1. Single mobile filling defect in the distal common bile duct consistent with choledocholithiasis. 2. The ampulla remains patent. Electronically Signed   By: Malachy MoanHeath  McCullough M.D.   On: 09/19/2016 09:22   Dg Ercp  Result Date: 09/20/2016 CLINICAL DATA:  Bile duct stones EXAM: ERCP TECHNIQUE: Multiple spot images obtained with the fluoroscopic device and submitted for interpretation post-procedure. FLUOROSCOPY TIME:  Fluoroscopy Time:  2 minutes and thirty-second Radiation Exposure Index (if provided by the fluoroscopic device): Number of Acquired Spot Images: 7 COMPARISON:  None. FINDINGS: Filling defects in the distal common bile duct are noted on preliminary images. Sphincterotomy device is noted. IMPRESSION: See above. These images were submitted for radiologic interpretation only. Please see the procedural  report for the amount of contrast and the fluoroscopy time utilized. Electronically Signed   By: Jolaine ClickArthur  Hoss M.D.   On: 09/20/2016 11:05   Dg Ercp Biliary & Pancreatic Ducts  Result Date: 09/19/2016 CLINICAL DATA:  50 year old female with cholelithiasis and choledocholithiasis EXAM: ERCP TECHNIQUE: Multiple spot images obtained with the fluoroscopic device and submitted for interpretation post-procedure. FLUOROSCOPY TIME:  Fluoroscopy Time:  8 minutes 47 seconds reported COMPARISON:  Right upper quadrant ultrasound 09/17/2016 FINDINGS: Total of 5 intraoperative spot images and cine clips demonstrate a flexible endoscope in the descending duodenum followed by cannulation of the common bile duct. Contrast opacification demonstrates a dilated common bile duct with numerous faceted filling defects consistent with extensive choledocholithiasis. The cystic duct is patent. Contrast material opacifies the gallbladder which is filled with small faceted stones. Subsequent images demonstrate sphincterotomy and balloon sweep of the common bile duct. IMPRESSION: 1. Cholelithiasis and choledocholithiasis. 2. Sphincterotomy and balloon sweeping of the common duct. These images were submitted for radiologic interpretation only. Please see the procedural report for the amount of contrast and the fluoroscopy time utilized. Electronically Signed   By: Malachy MoanHeath  McCullough M.D.   On: 09/19/2016 08:04   [place] Abdomen Limited Ruq  Result Date: 09/17/2016 CLINICAL DATA:  Epigastric pain for 1 day EXAM: US ABDOMEN LIMITED - RIGHT UPPER QUADRANT COMPARISON:  09/03/2016 FINDINGS: Gallbladder: Cholelithiasis is noted and well distended gallbladder. No significant gallbladder wall thickening is seen. No pericholecystic fluid is noted. Mild increased tenderness is noted over the gallbladder. Common bile duct: Diameter: 10 mm. There is increased echogenicity  noted in the distal common bile duct with significant enlargement when compared  with the prior exam consistent with distal common bile duct stones. Previous common bile duct measurement was 3 mm. Liver: No focal lesion identified. Mild intrahepatic ductal dilatation is seen. IMPRESSION: Cholelithiasis as well as evidence of distal common bile duct stones with significant biliary dilatation when compared with the prior exam. Electronically Signed   By: Alcide Clever M.D.   On: 09/17/2016 18:32    Time Spent in minutes  25 min.    Damaria Stofko M.D on 09/20/2016 at 6:29 PM  Between 7am to 7pm - Pager - [phone]  After 7pm go to [email or web address] - password [facility]  [facility] -  Office  [phone]

## 2016-09-20 NOTE — Op Note (Signed)
[facility] Patient Name: Breanna Harrison Procedure Date: 09/20/2016 MRN: [id number] Attending MD: Wilhemina Bonito. Marina Goodell , MD Date of Birth: October 19, 1966 CSN: [id number] Age: 50 Admit Type: Inpatient Procedure:                ERCP with biliary sphincterotomy and stone                            extraction Indications:              Bile duct stone(s) seen on IOC yesterday. Had ERCP                            with sphincterotomy and multiple CBD stone                            extraction 2 days ago Providers:                Wilhemina Bonito. Marina Goodell, MD, Dwain Sarna, RN, [facility], Technician, Albertina Senegal. Alday CRNA, CRNA Referring MD:              Medicines:                General Anesthesia Complications:            No immediate complications. Estimated Blood Loss:     Estimated blood loss: none. Procedure:                Pre-Anesthesia Assessment:                           - Prior to the procedure, a History and Physical                            was performed, and patient medications and                            allergies were reviewed. The patient is competent.                            The risks and benefits of the procedure and the                            sedation options and risks were discussed with the                            patient. All questions were answered and informed                            consent was obtained. Patient identification and                            proposed procedure were verified by the physician.  Mental Status Examination: alert and oriented.                            Airway Examination: normal oropharyngeal airway and                            neck mobility. Respiratory Examination: clear to                            auscultation. CV Examination: normal. Prophylactic                            Antibiotics: The patient does not require                            prophylactic  antibiotics. Prior Anticoagulants: The                            patient has taken no previous anticoagulant or                            antiplatelet agents. ASA Grade Assessment: II - A                            patient with mild systemic disease. After reviewing                            the risks and benefits, the patient was deemed in                            satisfactory condition to undergo the procedure.                            The anesthesia plan was to use General Anesthesia.                            Immediately prior to administration of medications,                            the patient was re-assessed for adequacy to receive                            sedatives. The heart rate, respiratory rate, oxygen                            saturations, blood pressure, adequacy of pulmonary                            ventilation, and response to care were monitored                            throughout the procedure by CRNA. The physical  status of the patient was re-assessed after the                            procedure.                           After obtaining informed consent, the scope was                            passed under direct vision. Throughout the                            procedure, the patient's blood pressure, pulse, and                            oxygen saturations were monitored continuously. The                            Endoscope was introduced through the mouth, and                            used to inject contrast into and used to inject                            contrast into the bile duct. The ERCP was                            accomplished without difficulty. The patient                            tolerated the procedure well. Scope In: Scope Out: Findings:      The esophagus was successfully intubated under direct vision. The scope       was advanced to a normal major papilla in the descending duodenum        without detailed examination of the pharynx, larynx and associated       structures, and upper GI tract. The upper GI tract was grossly normal.       Prior sphincterotomy evident but deemed small in size.The bile duct was       deeply cannulated. Contrast was injected. I personally interpreted the       bile duct images. Opacification of the entire biliary tree except for       the gallbladder was successful. The common bile duct contained one       stone, which was 5 mm in diameter. A cholecystectomy had been performed.       A wire was passed into the biliary tree. Biliary sphincterotomy was made       (extended) with a traction (standard) sphincterotome using ERBE       electrocautery. There was no post-sphincterotomy bleeding. The biliary       tree was swept with a 15 mm balloon starting at the bifurcation. All       stones were removed (one). No stones remained. Impression:               - The patient has had a cholecystectomy.                           -  Choledocholithiasis was found. Complete removal                            was accomplished by balloon extraction.                           - A biliary sphincterotomy was performed.                           - The biliary tree was swept. Moderate Sedation:      none Recommendation:           1. Standard post procedure observation                           2. Clear liquid diet                           3. If doing well later this afternoon, could go                            home. Low fat diet for 24 hours                           4. Standard out patient surgical follow up. Routine                            GI follow up not needed                           Discussed with patinet and family. Will inform                            primary service. Call for questions or problems.                            Thanks Procedure Code(s):        --- Professional ---                           [phone], Endoscopic retrograde                             cholangiopancreatography (ERCP); with removal of                            calculi/debris from biliary/pancreatic duct(s)                           43262, Endoscopic retrograde                            cholangiopancreatography (ERCP); with                            sphincterotomy/papillotomy Diagnosis Code(s):        --- Professional ---  Z90.49, Acquired absence of other specified parts                            of digestive tract                           K80.50, Calculus of bile duct without cholangitis                            or cholecystitis without obstruction CPT copyright 2016 American Medical Association. All rights reserved. The codes documented in this report are preliminary and upon coder review may  be revised to meet current compliance requirements. Wilhemina BonitoJohn N. Marina GoodellPerry, MD 09/20/2016 11:07:11 AM This report has been signed electronically. Number of Addenda: 0

## 2016-09-20 NOTE — Anesthesia Preprocedure Evaluation (Signed)
Anesthesia Evaluation    Reviewed: Allergy & Precautions, Patient's Chart, lab work & pertinent test results  Airway Mallampati: II  TM Distance: >3 FB Neck ROM: Full    Dental  (+) Dental Advisory Given   Pulmonary neg pulmonary ROS,    breath sounds clear to auscultation       Cardiovascular hypertension, Pt. on medications  Rhythm:Regular Rate:Normal     Neuro/Psych negative neurological ROS  negative psych ROS   GI/Hepatic Elevated LFT's Cholelithiasis with Acute/Chronic Cholecystitis Choledocholithiasis S/P ERCP with sphincterotomy and stone extraction, Lap Chole recently   Endo/Other  negative endocrine ROS  Renal/GU negative Renal ROS  negative genitourinary   Musculoskeletal negative musculoskeletal ROS (+)   Abdominal   Peds  Hematology negative hematology ROS (+)   Anesthesia Other Findings   Reproductive/Obstetrics negative OB ROS                             Lab Results  Component Value Date   WBC 16.4 (H) 09/20/2016   HGB 11.0 (L) 09/20/2016   HCT 33.1 (L) 09/20/2016   MCV 94.8 09/20/2016   PLT 202 09/20/2016   Lab Results  Component Value Date   CREATININE 0.72 09/19/2016   BUN 10 09/19/2016   NA 139 09/19/2016   K 4.0 09/19/2016   CL 114 (H) 09/19/2016   CO2 18 (L) 09/19/2016    Anesthesia Physical  Anesthesia Plan  ASA: II  Anesthesia Plan: General   Post-op Pain Management:    Induction: Intravenous  Airway Management Planned: Oral ETT  Additional Equipment:   Intra-op Plan:   Post-operative Plan: Extubation in OR  Informed Consent:   Plan Discussed with: CRNA, Anesthesiologist and Surgeon  Anesthesia Plan Comments:         Anesthesia Quick Evaluation

## 2016-09-20 NOTE — Anesthesia Procedure Notes (Signed)
Procedure Name: Intubation Date/Time: 09/20/2016 10:16 AM Performed by: Doran ClayALDAY, Viral Schramm R Pre-anesthesia Checklist: Patient identified, Emergency Drugs available, Suction available, Patient being monitored and Timeout performed Patient Re-evaluated:Patient Re-evaluated prior to inductionOxygen Delivery Method: Circle system utilized Preoxygenation: Pre-oxygenation with 100% oxygen Intubation Type: IV induction Laryngoscope Size: Mac and 3 Grade View: Grade II Tube type: Oral Tube size: 7.0 mm Number of attempts: 1 Airway Equipment and Method: Stylet Placement Confirmation: ETT inserted through vocal cords under direct vision,  positive ETCO2 and breath sounds checked- equal and bilateral Secured at: 21 cm Tube secured with: Tape Dental Injury: Teeth and Oropharynx as per pre-operative assessment

## 2016-09-20 NOTE — Transfer of Care (Signed)
Immediate Anesthesia Transfer of Care Note  Patient: Breanna AlstromSusan Harrison  Procedure(s) Performed: Procedure(s): ENDOSCOPIC RETROGRADE CHOLANGIOPANCREATOGRAPHY (ERCP) (N/A)  Patient Location: PACU  Anesthesia Type:General  Level of Consciousness: sedated  Airway & Oxygen Therapy: Patient Spontanous Breathing and Patient connected to face mask oxygen  Post-op Assessment: Report given to RN and Post -op Vital signs reviewed and stable  Post vital signs: Reviewed and stable  Last Vitals:  Vitals:   09/20/16 0508 09/20/16 0940  BP: 126/61 (!) 170/74  Pulse: (!) 53 (!) 51  Resp: 16 13  Temp: 37 C 36.6 C    Last Pain:  Vitals:   09/20/16 0940  TempSrc: Oral  PainSc: 4       Patients Stated Pain Goal: 2 (09/20/16 0447)  Complications: No apparent anesthesia complications

## 2016-09-21 ENCOUNTER — Encounter: Payer: Self-pay | Admitting: General Surgery

## 2016-09-21 DIAGNOSIS — K8010 Calculus of gallbladder with chronic cholecystitis without obstruction: Secondary | ICD-10-CM

## 2016-09-21 LAB — COMPREHENSIVE METABOLIC PANEL
ALT: 78 U/L — AB (ref 14–54)
AST: 46 U/L — AB (ref 15–41)
Albumin: 3.2 g/dL — ABNORMAL LOW (ref 3.5–5.0)
Alkaline Phosphatase: 86 U/L (ref 38–126)
Anion gap: 6 (ref 5–15)
BILIRUBIN TOTAL: 0.5 mg/dL (ref 0.3–1.2)
BUN: 11 mg/dL (ref 6–20)
CO2: 24 mmol/L (ref 22–32)
CREATININE: 0.85 mg/dL (ref 0.44–1.00)
Calcium: 8.1 mg/dL — ABNORMAL LOW (ref 8.9–10.3)
Chloride: 110 mmol/L (ref 101–111)
GFR calc Af Amer: >60 mL/min (ref >60)
GLUCOSE: 81 mg/dL (ref 65–99)
Potassium: 3.4 mmol/L — ABNORMAL LOW (ref 3.5–5.1)
Sodium: 140 mmol/L (ref 135–145)
TOTAL PROTEIN: 5.9 g/dL — AB (ref 6.5–8.1)

## 2016-09-21 LAB — CBC
HEMATOCRIT: 32.5 % — AB (ref 36.0–46.0)
HEMOGLOBIN: 11 g/dL — AB (ref 12.0–15.0)
MCH: 31.8 pg (ref 26.0–34.0)
MCHC: 33.8 g/dL (ref 30.0–36.0)
MCV: 93.9 fL (ref 78.0–100.0)
Platelets: 211 10*3/uL (ref 150–400)
RBC: 3.46 MIL/uL — ABNORMAL LOW (ref 3.87–5.11)
RDW: 12.6 % (ref 11.5–15.5)
WBC: 14.4 10*3/uL — ABNORMAL HIGH (ref 4.0–10.5)

## 2016-09-21 MED ORDER — POLYETHYLENE GLYCOL 3350 17 G PO PACK
17.0000 g | PACK | Freq: Every day | ORAL | Status: DC
  Administered 2016-09-21: 17 g via ORAL

## 2016-09-21 MED ORDER — OXYCODONE HCL 5 MG PO TABS: 5.0000 mg | ORAL_TABLET | Freq: Four times a day (QID) | ORAL | 0 refills | Status: AC | PRN

## 2016-09-21 NOTE — Progress Notes (Signed)
Assessment unchanged. Pt and husband verbalized understanding of dc instructions including when to call the doctor and follow up care. Script given as provided by MD as well as note for work. Discharged via foot per pt request to front entrance accompanied by husband and NT.

## 2016-09-21 NOTE — Progress Notes (Signed)
Patient ID: Breanna AlstromSusan Harrison, female   DOB: 02/23/1966, 50 y.o.   MRN: [id number]  [facility]  1 Day Post-Op  Subjective: Feeling better today. Tolerating diet. Passing flatus, no BM. Using mostly tylenol for pain control. Ambulating halls with no difficulty.  Objective: Vital signs in last 24 hours: Temp:  [98 F (36.7 C)-99 F (37.2 C)] 99 F (37.2 C) (11/28 0516) Pulse Rate:  [50-57] 56 (11/28 0516) Resp:  [14-16] 14 (11/28 0516) BP: (153-174)/(72-89) 174/89 (11/28 0516) SpO2:  [98 %-100 %] 98 % (11/28 0516) Last BM Date: 09/17/16  Intake/Output from previous day: 11/27 0701 - 11/28 0700 In: 2708.8 [P.O.:120; I.V.:2588.8] Out: 2150 [Urine:2150] Intake/Output this shift: Total I/O In: 240 [P.O.:240] Out: 100 [Urine:100]  PE: Gen:  Alert, NAD, pleasant Pulm:  Effort normal Abd: Soft, mild distension, appropriately tender, +BS, incisions C/D/I  Lab Results:   Recent Labs  09/20/16 0418 09/21/16 0437  WBC 16.4* 14.4*  HGB 11.0* 11.0*  HCT 33.1* 32.5*  PLT 202 211   BMET  Recent Labs  09/19/16 0453 09/21/16 0437  NA 139 140  K 4.0 3.4*  CL 114* 110  CO2 18* 24  GLUCOSE 103* 81  BUN 10 11  CREATININE 0.72 0.85  CALCIUM 8.2* 8.1*   PT/INR No results for input(s): LABPROT, INR in the last 72 hours. CMP     Component Value Date/Time   NA 140 09/21/2016 0437   K 3.4 (L) 09/21/2016 0437   CL 110 09/21/2016 0437   CO2 24 09/21/2016 0437   GLUCOSE 81 09/21/2016 0437   BUN 11 09/21/2016 0437   CREATININE 0.85 09/21/2016 0437   CALCIUM 8.1 (L) 09/21/2016 0437   PROT 5.9 (L) 09/21/2016 0437   ALBUMIN 3.2 (L) 09/21/2016 0437   AST 46 (H) 09/21/2016 0437   ALT 78 (H) 09/21/2016 0437   ALKPHOS 86 09/21/2016 0437   BILITOT 0.5 09/21/2016 0437   GFRNONAA >60 09/21/2016 0437   GFRAA >60 09/21/2016 0437   Lipase     Component Value Date/Time   LIPASE 23 09/17/2016 1729       Studies/Results: Dg Ercp  Result Date:  09/20/2016 CLINICAL DATA:  Bile duct stones EXAM: ERCP TECHNIQUE: Multiple spot images obtained with the fluoroscopic device and submitted for interpretation post-procedure. FLUOROSCOPY TIME:  Fluoroscopy Time:  2 minutes and thirty-second Radiation Exposure Index (if provided by the fluoroscopic device): Number of Acquired Spot Images: 7 COMPARISON:  None. FINDINGS: Filling defects in the distal common bile duct are noted on preliminary images. Sphincterotomy device is noted. IMPRESSION: See above. These images were submitted for radiologic interpretation only. Please see the procedural report for the amount of contrast and the fluoroscopy time utilized. Electronically Signed   By: Jolaine ClickArthur  Hoss M.D.   On: 09/20/2016 11:05    Anti-infectives: Anti-infectives    Start     Dose/Rate Route Frequency Ordered Stop   09/19/16 0700  ceFAZolin (ANCEF) IVPB 2g/100 mL premix     2 g 200 mL/hr over 30 Minutes Intravenous On call to O.R. 09/19/16 0649 09/19/16 0739   09/18/16 1530  ciprofloxacin (CIPRO) IVPB 400 mg     400 mg 200 mL/hr over 60 Minutes Intravenous  Once 09/18/16 1517 09/18/16 1619       Assessment/Plan Symptomatic cholelithiasis s/p lap chole 11/26 POD 2 Choledocholithiasis s/p ERCP 11/25 and 11/27 -- per GI  FEN - regular VTE - lovenox  Plan - WBC trending down. Pain well controlled and  patient tolerating diet. Ready for discharge from surgical standpoint. She will follow-up with Dr. Andrey CampanileWilson in about 3 weeks. Work note written and placed in chart. I will also print her a small rx for pain medication.    LOS: 4 days    Edson SnowballBROOKE A MILLER , [facility] 09/21/2016, 11:20 AM Pager: [phone] Consults: [phone] Mon-Fri 7:00 am-4:30 pm Sat-Sun 7:00 am-11:30 am

## 2016-09-21 NOTE — Discharge Summary (Signed)
Physician Discharge Summary  Breanna AlstromSusan Prewitt Harrison:[id number] DOB: 03/03/1966 DOA: 09/17/2016  PCP: Breanna ColasHUNTER, Breanna A, FNP  Admit date: 09/17/2016 Discharge date: 09/21/2016  Admitted From: Home.  Disposition:  Home.   Recommendations for Outpatient Follow-up:  1. Follow up with PCP in 1-2 weeks 2. Please obtain BMP/CBC in one week 3. Please follow up  With surgery as recommended.     Discharge Condition:stable.  CODE STATUS: full code.  Diet recommendation: Heart Healthy   Brief/Interim Summary: 50 year old female with hypertension and recently diagnosed biliary colic presenting with epigastric and right upper quadrant abdominal pain. Patient found to have acute choledocholithiasis.  Discharge Diagnoses:  Principal Problem:   Choledocholithiasis s/p ERCP 09/18/2016 Active Problems:   Essential hypertension   Chronic cholecystitis with calculus   Transaminitis Acute Choledocholithiasis  -Evaluated by GI and surgery. Patient underwent ERCP with several gallstones and underwent removal with biliary sphincterotomy. Underwent laproscopic cholecystectomy with IOC concerning for retained gallstones. Stable postop. Transaminitis slowly improving. -underwent ERCP shows choledocholithiasis , complete removal by baloon extraction and a biliary sphincterotomy was done. Low fat diet for 24 hours.       Essential hypertension Well controlled.   Continue lisinopril.    Discharge Instructions  Discharge Instructions    Diet general    Complete by:  As directed    Discharge instructions    Complete by:  As directed    PLEASE follow up with gastroenterology and surgery as needed and as recommended.       Medication List    TAKE these medications   alum & mag hydroxide-simeth 200-200-20 MG/5ML suspension Commonly known as:  MAALOX/MYLANTA Take 30 mLs by mouth every 6 (six) hours as needed for indigestion or heartburn.   calcium carbonate 500 MG chewable tablet Commonly known  as:  TUMS - dosed in mg elemental calcium Chew 3 tablets by mouth as needed for indigestion or heartburn.   EXCEDRIN MIGRAINE 250-250-65 MG tablet Generic drug:  aspirin-acetaminophen-caffeine Take 1 tablet by mouth every 6 (six) hours as needed for headache.   lisinopril 5 MG tablet Commonly known as:  PRINIVIL,ZESTRIL Take 5 mg by mouth daily with breakfast.   MELODETTA 24 FE 1-20 MG-MCG(24) Chew Generic drug:  Norethin Ace-Eth Estrad-FE Chew 1 tablet by mouth daily.   multivitamin with minerals Tabs tablet Take 1 tablet by mouth daily with breakfast.   naproxen sodium 220 MG tablet Commonly known as:  ANAPROX Take 440 mg by mouth daily as needed (for pain).   oxyCODONE 5 MG immediate release tablet Commonly known as:  Oxy IR/ROXICODONE Take 1-2 tablets (5-10 mg total) by mouth every 6 (six) hours as needed for moderate pain.   Vitamin D3 3000 units Tabs Take 2 tablets by mouth daily with breakfast.      Follow-up Information    Breanna InaWILSON,[name], MD. Schedule an appointment as soon as possible for a visit in 3 week(s).   Specialty:  General Surgery Contact information: [address] [place] [address] [phone]          Allergies  Allergen Reactions  . Sulfamethoxazole Itching and Rash    Consultations:  GI consult  Surgery    Procedures/Studies: Dg Cholangiogram Operative  Result Date: 09/19/2016 CLINICAL DATA:  50 year old female undergoing intraoperative cholangiogram during cholecystectomy for cholelithiasis. EXAM: INTRAOPERATIVE CHOLANGIOGRAM TECHNIQUE: Cholangiographic images from the C-arm fluoroscopic device were submitted for interpretation post-operatively. Please see the procedural report for the amount of contrast and the fluoroscopy time utilized. COMPARISON:  ERCP performed yesterday. FINDINGS: Cine clip performed at the time of laparoscopic cholecystectomy and intraoperative cholangiogram demonstrates cannulation of the cystic  duct remanent and opacification of the biliary tree. There is a single mobile filling defect in the distal common bile duct consistent with choledocholithiasis. The ampulla remains patent. Contrast material passes freely through the ampulla and into the duodenum. IMPRESSION: 1. Single mobile filling defect in the distal common bile duct consistent with choledocholithiasis. 2. The ampulla remains patent. Electronically Signed   By: [name] M.D.   On: 09/19/2016 09:22   Dg Ercp  Result Date: 09/20/2016 CLINICAL DATA:  Bile duct stones EXAM: ERCP TECHNIQUE: Multiple spot images obtained with the fluoroscopic device and submitted for interpretation post-procedure. FLUOROSCOPY TIME:  Fluoroscopy Time:  2 minutes and thirty-second Radiation Exposure Index (if provided by the fluoroscopic device): Number of Acquired Spot Images: 7 COMPARISON:  None. FINDINGS: Filling defects in the distal common bile duct are noted on preliminary images. Sphincterotomy device is noted. IMPRESSION: See above. These images were submitted for radiologic interpretation only. Please see the procedural report for the amount of contrast and the fluoroscopy time utilized. Electronically Signed   By: [name] M.D.   On: 09/20/2016 11:05   Dg Ercp Biliary & Pancreatic Ducts  Result Date: 09/19/2016 CLINICAL DATA:  50 year old female with cholelithiasis and choledocholithiasis EXAM: ERCP TECHNIQUE: Multiple spot images obtained with the fluoroscopic device and submitted for interpretation post-procedure. FLUOROSCOPY TIME:  Fluoroscopy Time:  8 minutes 47 seconds reported COMPARISON:  Right upper quadrant ultrasound 09/17/2016 FINDINGS: Total of 5 intraoperative spot images and cine clips demonstrate a flexible endoscope in the descending duodenum followed by cannulation of the common bile duct. Contrast opacification demonstrates a dilated common bile duct with numerous faceted filling defects consistent with extensive  choledocholithiasis. The cystic duct is patent. Contrast material opacifies the gallbladder which is filled with small faceted stones. Subsequent images demonstrate sphincterotomy and balloon sweep of the common bile duct. IMPRESSION: 1. Cholelithiasis and choledocholithiasis. 2. Sphincterotomy and balloon sweeping of the common duct. These images were submitted for radiologic interpretation only. Please see the procedural report for the amount of contrast and the fluoroscopy time utilized. Electronically Signed   By: [name] M.D.   On: 09/19/2016 08:04   US Abdomen Limited Ruq  Result Date: 09/17/2016 CLINICAL DATA:  Epigastric pain for 1 day EXAM: US ABDOMEN LIMITED - RIGHT UPPER QUADRANT COMPARISON:  09/03/2016 FINDINGS: Gallbladder: Cholelithiasis is noted and well distended gallbladder. No significant gallbladder wall thickening is seen. No pericholecystic fluid is noted. Mild increased tenderness is noted over the gallbladder. Common bile duct: Diameter: 10 mm. There is increased echogenicity noted in the distal common bile duct with significant enlargement when compared with the prior exam consistent with distal common bile duct stones. Previous common bile duct measurement was 3 mm. Liver: No focal lesion identified. Mild intrahepatic ductal dilatation is seen. IMPRESSION: Cholelithiasis as well as evidence of distal common bile duct stones with significant biliary dilatation when compared with the prior exam. Electronically Signed   By: Alcide Clever M.D.   On: 09/17/2016 18:32       Subjective: No new complaints.   Discharge Exam: Vitals:   09/20/16 2118 09/21/16 0516  BP: (!) 163/80 (!) 174/89  Pulse: (!) 57 (!) 56  Resp: 15 14  Temp: 98.6 F (37 C) 99 F (37.2 C)   Vitals:   09/20/16 1245 09/20/16 1355 09/20/16 2118 09/21/16 0516  BP: (!) 153/72 Marland Kitchen)  162/74 (!) 163/80 (!) 174/89  Pulse: (!) 54 (!) 54 (!) 57 (!) 56  Resp: 16 16 15 14   Temp: 98.3 F (36.8 C) 98.3 F  (36.8 C) 98.6 F (37 C) 99 F (37.2 C)  TempSrc: Oral Oral Oral Oral  SpO2: 100% 100% 100% 98%  Weight:      Height:        General: Pt is alert, awake, not in acute distress Cardiovascular: RRR, S1/S2 +, no rubs, no gallops Respiratory: CTA bilaterally, no wheezing, no rhonchi Abdominal: Soft, NT, ND, bowel sounds + Extremities: no edema, no cyanosis    The results of significant diagnostics from this hospitalization (including imaging, microbiology, ancillary and laboratory) are listed below for reference.     Microbiology: Recent Results (from the past 240 hour(s))  Surgical PCR screen     Status: None   Collection Time: 09/19/16  6:46 AM  Result Value Ref Range Status   MRSA, PCR NEGATIVE NEGATIVE Final   Staphylococcus aureus NEGATIVE NEGATIVE Final    Comment:        The Xpert SA Assay (FDA approved for NASAL specimens in patients over 51 years of age), is one component of a comprehensive surveillance program.  Test performance has been validated by [facility] for patients greater than or equal to 51 year old. It is not intended to diagnose infection nor to guide or monitor treatment.      Labs: BNP (last 3 results) No results for input(s): BNP in the last 8760 hours. Basic Metabolic Panel:  Recent Labs Lab 09/17/16 1729 09/18/16 0523 09/19/16 0453 09/21/16 0437  NA 139 139 139 140  K 3.6 3.9 4.0 3.4*  CL 106 112* 114* 110  CO2 25 22 18* 24  GLUCOSE 102* 90 103* 81  BUN 13 14 10 11   CREATININE 0.91 0.92 0.72 0.85  CALCIUM 8.9 8.4* 8.2* 8.1*   Liver Function Tests:  Recent Labs Lab 09/17/16 1729 09/18/16 0523 09/19/16 0453 09/20/16 0418 09/21/16 0437  AST 106* 117* 46* 35 46*  ALT 119* 149* 100* 77* 78*  ALKPHOS 129* 130* 115 87 86  BILITOT 1.5* 1.4* 0.9 0.9 0.5  PROT 7.5 6.6 6.1* 5.5* 5.9*  ALBUMIN 4.0 3.5 3.3* 3.0* 3.2*    Recent Labs Lab 09/17/16 1729  LIPASE 23   No results for input(s): AMMONIA in the last 168  hours. CBC:  Recent Labs Lab 09/17/16 1729 09/18/16 0523 09/20/16 0418 09/21/16 0437  WBC 13.4* 10.7* 16.4* 14.4*  NEUTROABS 7.5  --   --   --   HGB 13.2 12.4 11.0* 11.0*  HCT 39.3 36.9 33.1* 32.5*  MCV 92.5 92.7 94.8 93.9  PLT 265 249 202 211   Cardiac Enzymes: No results for input(s): CKTOTAL, CKMB, CKMBINDEX, TROPONINI in the last 168 hours. BNP: Invalid input(s): POCBNP CBG: No results for input(s): GLUCAP in the last 168 hours. D-Dimer No results for input(s): DDIMER in the last 72 hours. Hgb A1c No results for input(s): HGBA1C in the last 72 hours. Lipid Profile No results for input(s): CHOL, HDL, LDLCALC, TRIG, CHOLHDL, LDLDIRECT in the last 72 hours. Thyroid function studies No results for input(s): TSH, T4TOTAL, T3FREE, THYROIDAB in the last 72 hours.  Invalid input(s): FREET3 Anemia work up No results for input(s): VITAMINB12, FOLATE, FERRITIN, TIBC, IRON, RETICCTPCT in the last 72 hours. Urinalysis    Component Value Date/Time   COLORURINE YELLOW 09/17/2016 1556   APPEARANCEUR CLEAR 09/17/2016 1556   LABSPEC 1.007 09/17/2016 1556  PHURINE 8.0 09/17/2016 1556   GLUCOSEU NEGATIVE 09/17/2016 1556   HGBUR NEGATIVE 09/17/2016 1556   BILIRUBINUR NEGATIVE 09/17/2016 1556   KETONESUR NEGATIVE 09/17/2016 1556   PROTEINUR NEGATIVE 09/17/2016 1556   NITRITE NEGATIVE 09/17/2016 1556   LEUKOCYTESUR SMALL (A) 09/17/2016 1556   Sepsis Labs Invalid input(s): PROCALCITONIN,  WBC,  LACTICIDVEN Microbiology Recent Results (from the past 240 hour(s))  Surgical PCR screen     Status: None   Collection Time: 09/19/16  6:46 AM  Result Value Ref Range Status   MRSA, PCR NEGATIVE NEGATIVE Final   Staphylococcus aureus NEGATIVE NEGATIVE Final    Comment:        The Xpert SA Assay (FDA approved for NASAL specimens in patients over 78 years of age), is one component of a comprehensive surveillance program.  Test performance has been validated by [facility] for  patients greater than or equal to 71 year old. It is not intended to diagnose infection nor to guide or monitor treatment.      Time coordinating discharge: Over 30 minutes  SIGNED:   Kathlen Mody, MD  [facility] 09/21/2016, 1:20 PM Pager   If 7PM-7AM, please contact night-coverage [email or web address] Password [email or web address]

## 2016-09-22 ENCOUNTER — Encounter (HOSPITAL_COMMUNITY): Payer: Self-pay | Admitting: Internal Medicine

## 2017-11-26 IMAGING — RF DG CHOLANGIOGRAM OPERATIVE
1 series · 8 of 8 positions shown · non-contrast
Comparison: ERCP performed yesterday.

CLINICAL DATA: 50-year-old female undergoing intraoperative
cholangiogram during cholecystectomy for cholelithiasis.

EXAM:
INTRAOPERATIVE CHOLANGIOGRAM
TECHNIQUE: Cholangiographic images from the C-arm fluoroscopic device were
submitted for interpretation post-operatively. Please see the
procedural report for the amount of contrast and the fluoroscopy
time utilized.

[Series 1: run · 2 acquisitions, 8 frames shown]
[im 1/2]
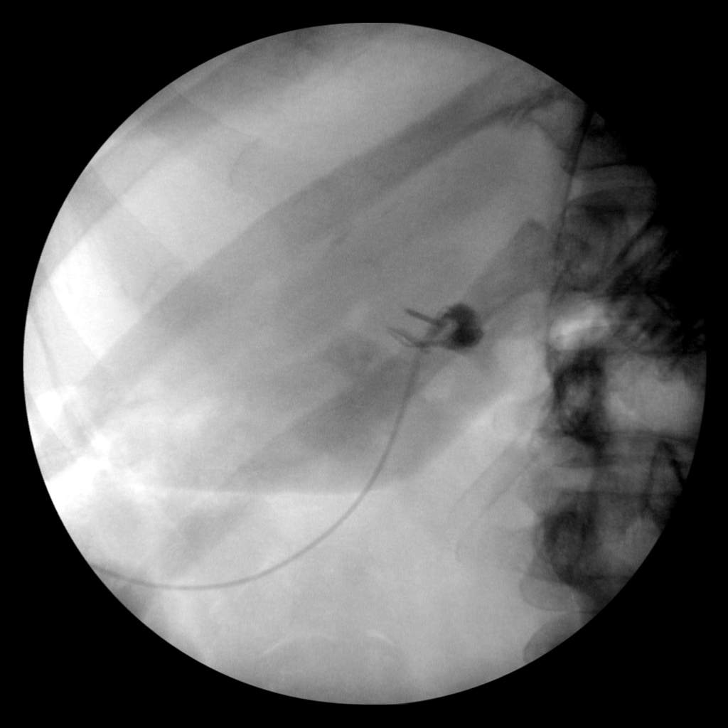
[im 1/2]
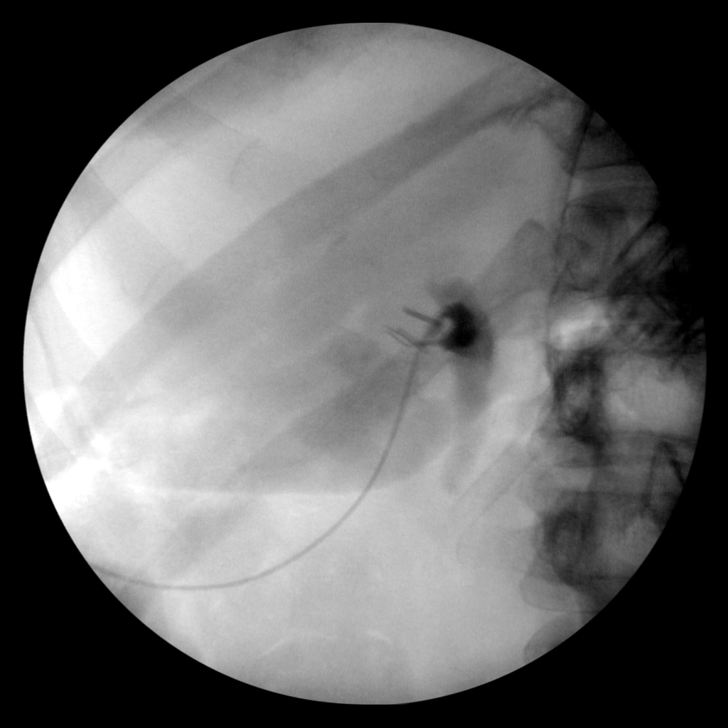
[im 1/2]
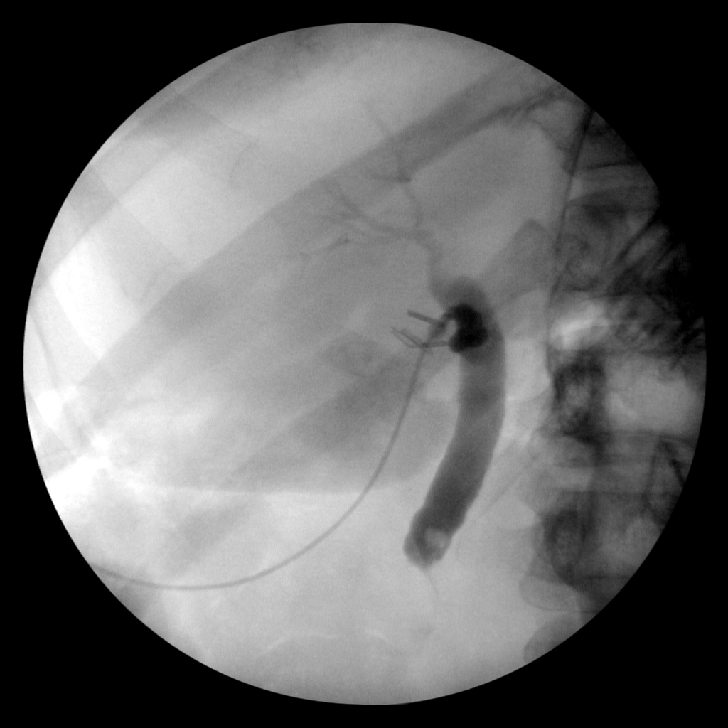
[im 1/2]
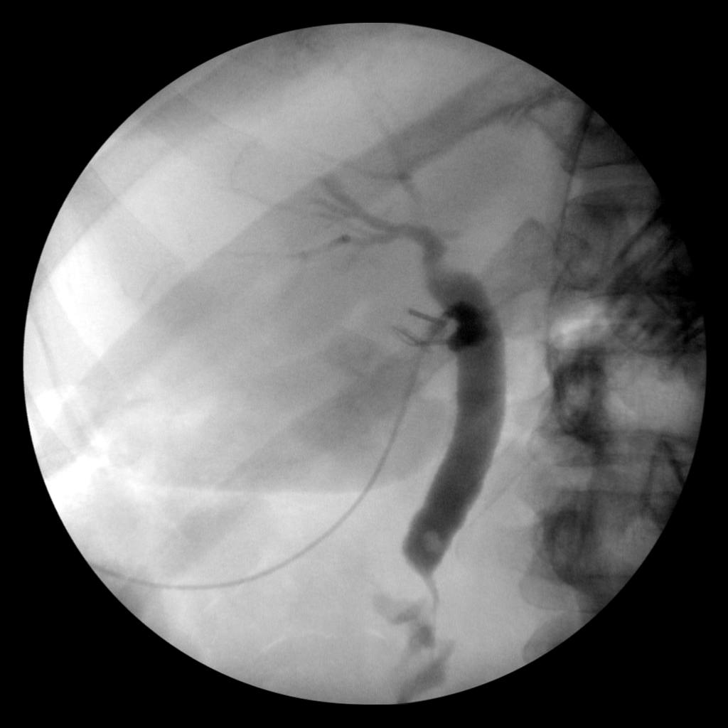
[im 2/2]
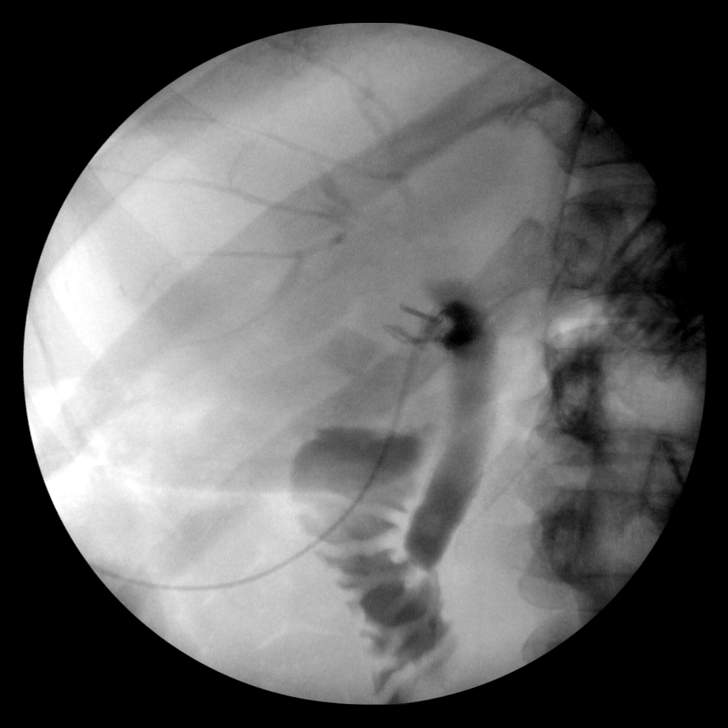
[im 2/2]
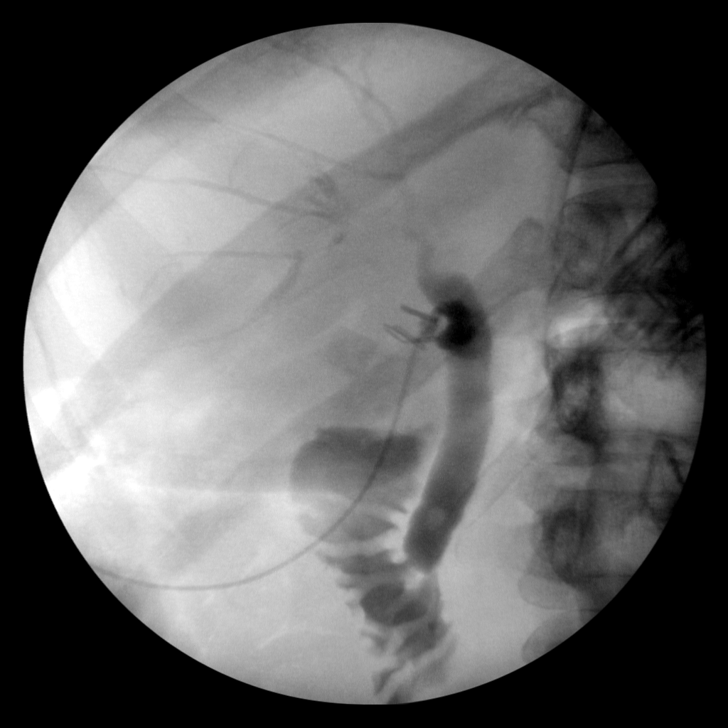
[im 2/2]
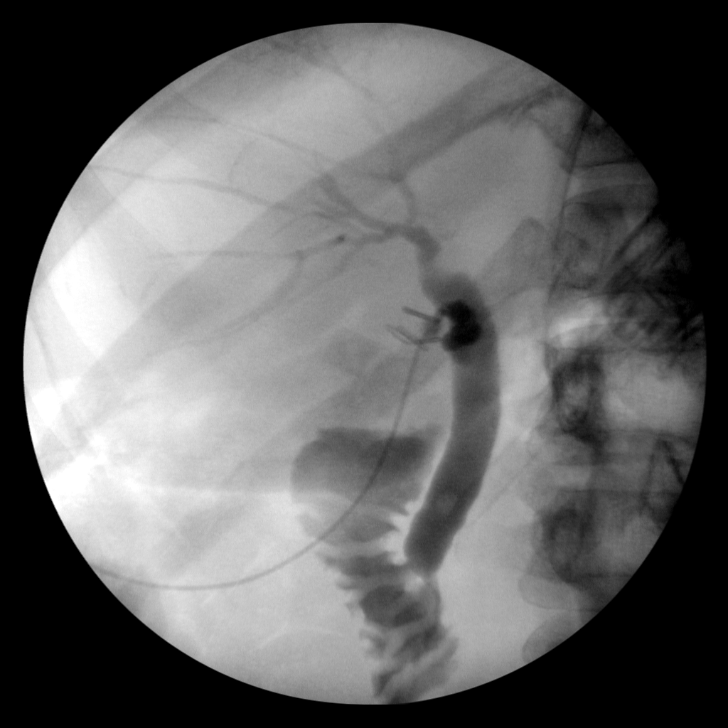
[im 2/2]
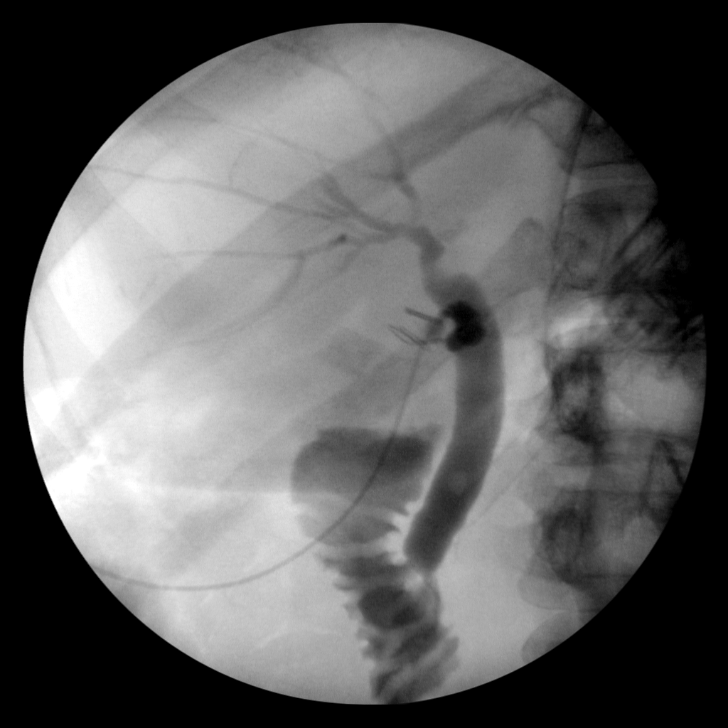

[8 of 8 positions shown; findings below may reference images not displayed]

FINDINGS: Cine clip performed at the time of laparoscopic cholecystectomy and
intraoperative cholangiogram demonstrates cannulation of the cystic
duct remanent and opacification of the biliary tree. There is a
single mobile filling defect in the distal common bile duct
consistent with choledocholithiasis. The ampulla remains patent.
Contrast material passes freely through the ampulla and into the
duodenum.
IMPRESSION: 1. Single mobile filling defect in the distal common bile duct
consistent with choledocholithiasis.
2. The ampulla remains patent.

## 2017-11-27 IMAGING — RF DG ERCP WO/W SPHINCTEROTOMY
1 series · 7 of 7 positions shown · non-contrast
Comparison: None.

CLINICAL DATA: Bile duct stones

EXAM:
ERCP
TECHNIQUE: Multiple spot images obtained with the fluoroscopic device and
submitted for interpretation post-procedure.
FLUOROSCOPY TIME:  Fluoroscopy Time:  2 minutes and thirty-second
Radiation Exposure Index (if provided by the fluoroscopic device):
Number of Acquired Spot Images: 7

[Series 1: run · 7 of 7 slices shown]
[im 1/7]
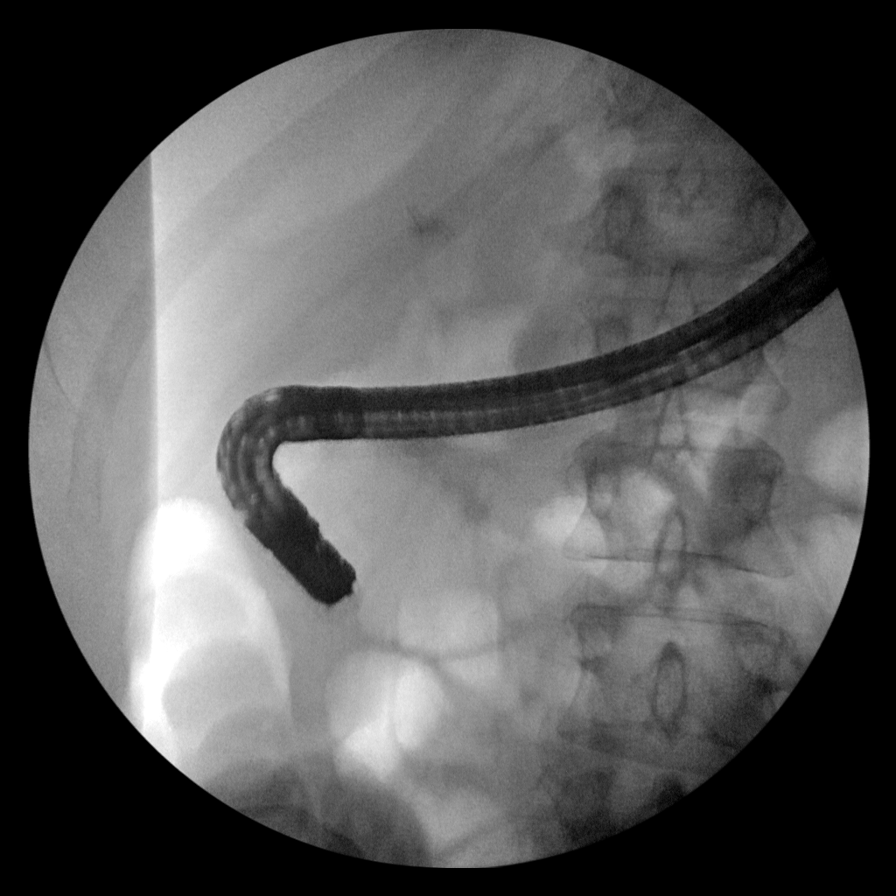
[im 2/7]
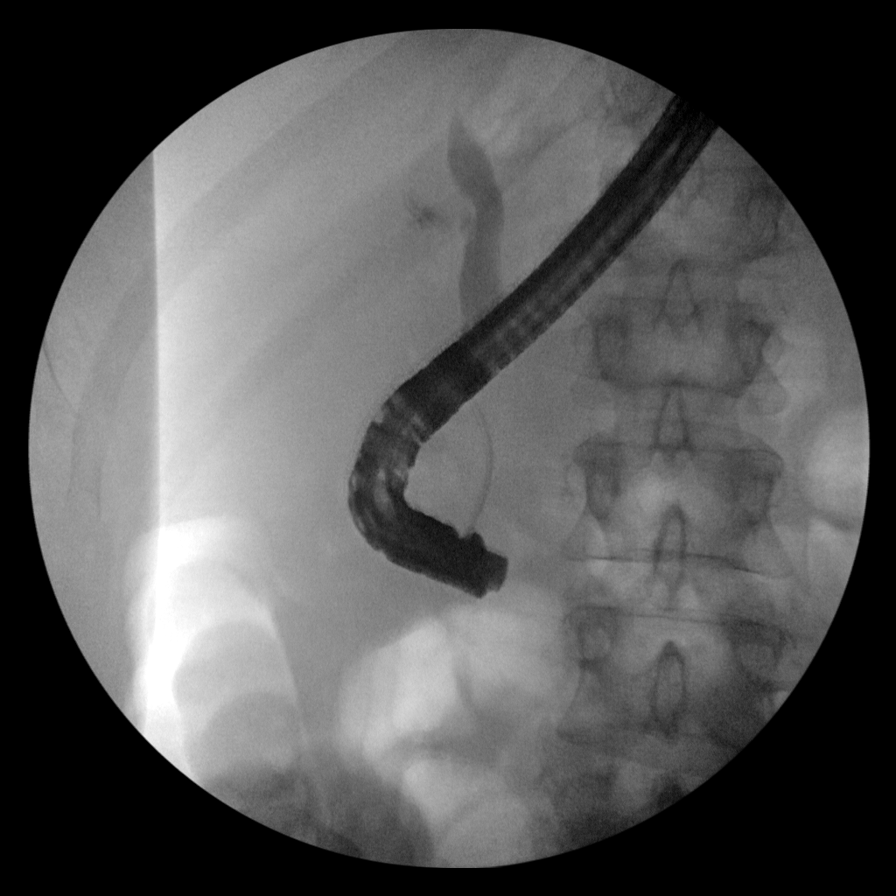
[im 3/7]
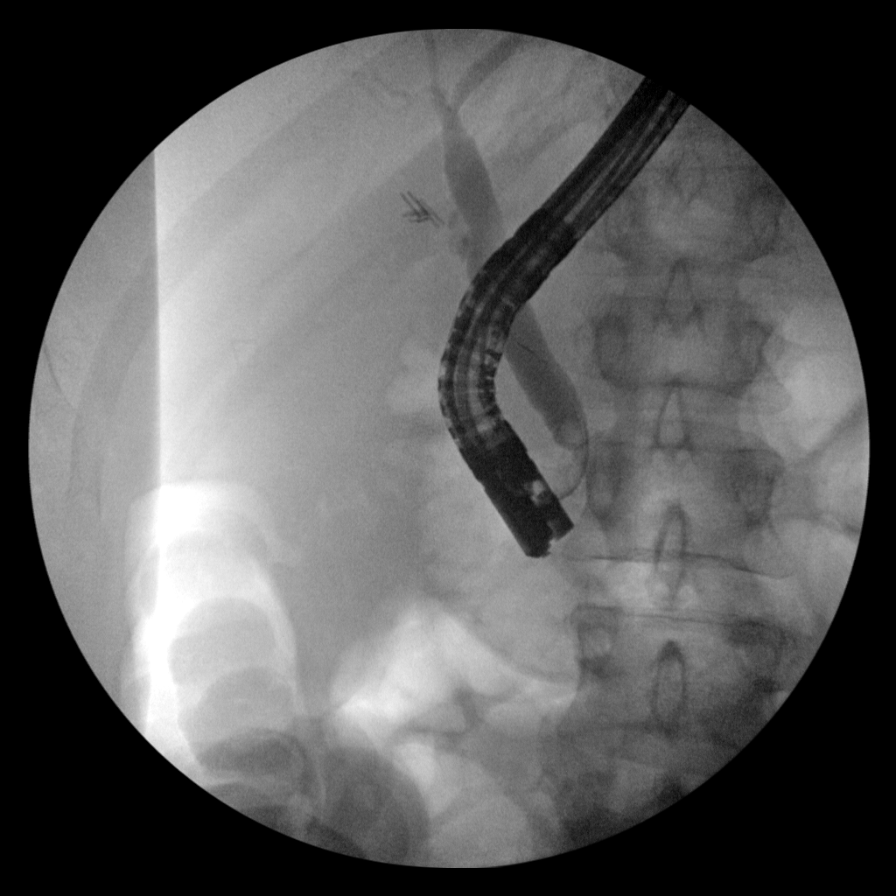
[im 4/7]
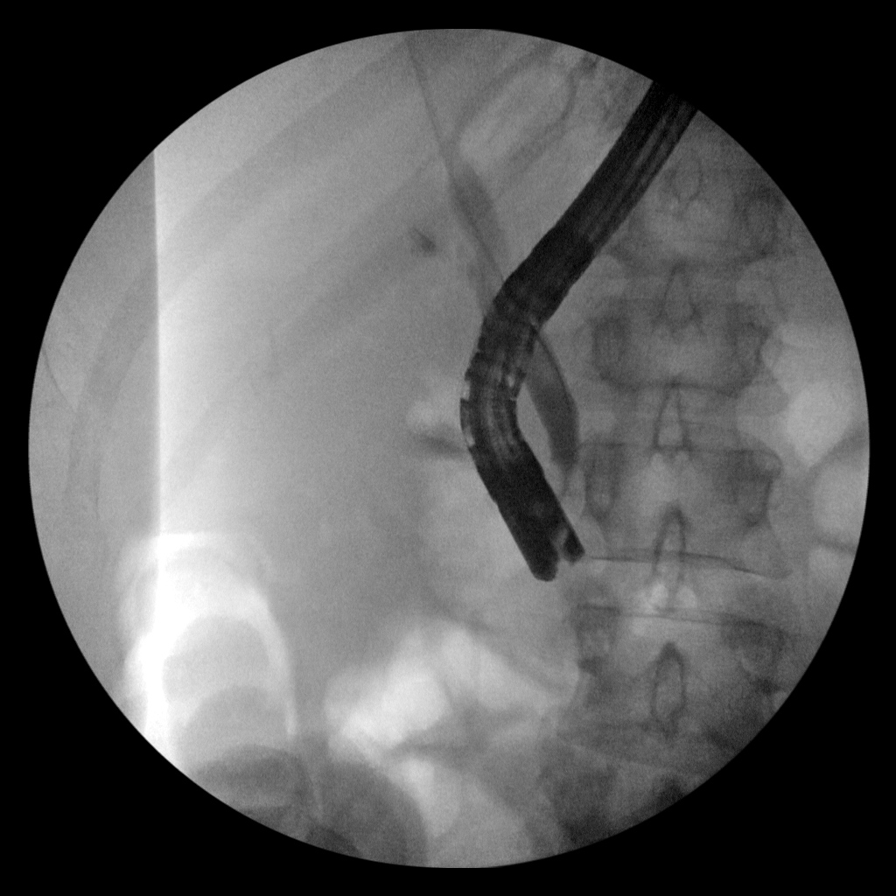
[im 5/7]
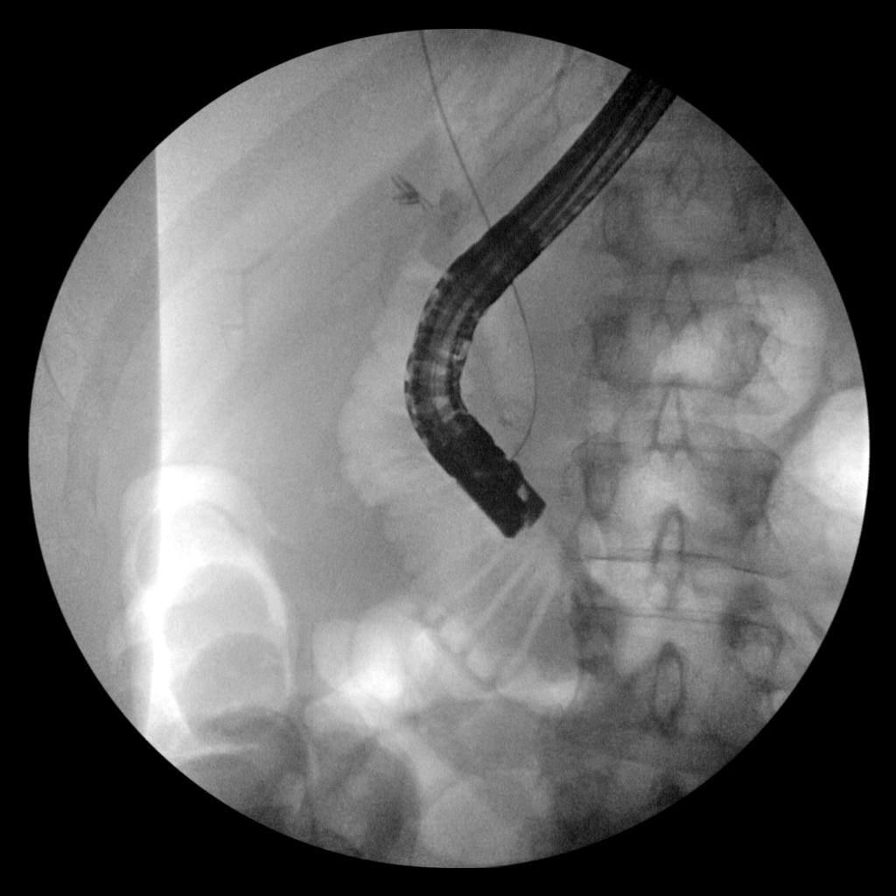
[im 6/7]
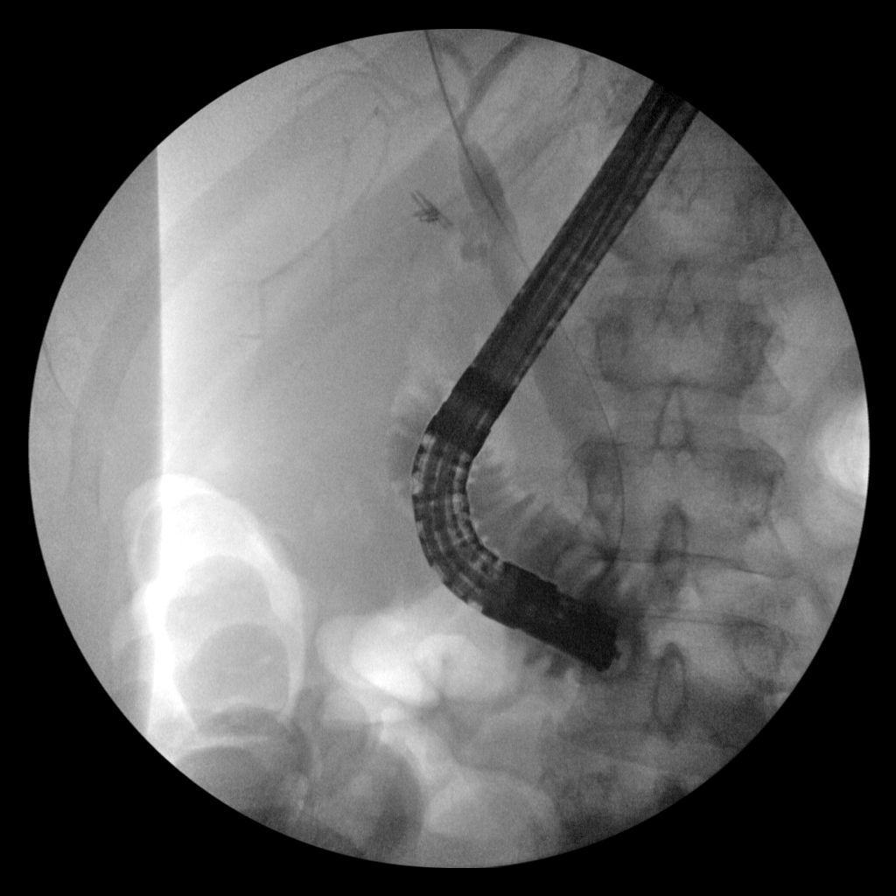
[im 7/7]
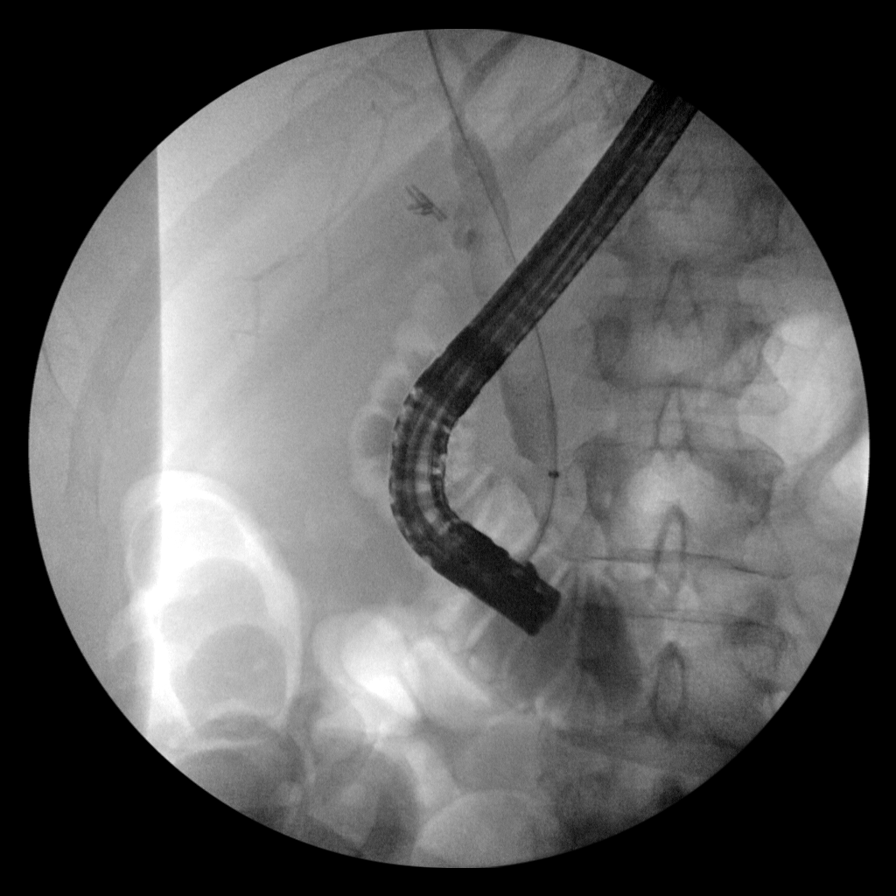

[7 of 7 positions shown; findings below may reference images not displayed]

FINDINGS: Filling defects in the distal common bile duct are noted on
preliminary images. Sphincterotomy device is noted.
IMPRESSION: See above.

These images were submitted for radiologic interpretation only.
Please see the procedural report for the amount of contrast and the
fluoroscopy time utilized.

## 2022-08-27 ENCOUNTER — Other Ambulatory Visit: Payer: Self-pay | Admitting: Obstetrics and Gynecology

## 2022-08-27 DIAGNOSIS — N951 Menopausal and female climacteric states: Secondary | ICD-10-CM

## 2022-09-01 ENCOUNTER — Other Ambulatory Visit: Payer: Self-pay | Admitting: Obstetrics and Gynecology

## 2022-09-01 DIAGNOSIS — E2839 Other primary ovarian failure: Secondary | ICD-10-CM

## 2023-02-11 ENCOUNTER — Ambulatory Visit
Admission: RE | Admit: 2023-02-11 | Discharge: 2023-02-11 | Disposition: A | Discharge status: Home / Self Care | Payer: BC Managed Care – PPO | Source: Ambulatory Visit | Attending: Obstetrics and Gynecology | Admitting: Obstetrics and Gynecology

## 2023-02-11 DIAGNOSIS — E2839 Other primary ovarian failure: Secondary | ICD-10-CM
